# Patient Record
Sex: Male | Born: 1950 | Race: White | Hispanic: No | Marital: Married | Smoking: Never smoker
Health system: Southern US, Community
[De-identification: ages and names within clinical notes are randomized; demographics above are authoritative.]

## PROBLEM LIST (undated history)

## (undated) DIAGNOSIS — I10 Essential (primary) hypertension: Secondary | ICD-10-CM

## (undated) DIAGNOSIS — E785 Hyperlipidemia, unspecified: Secondary | ICD-10-CM

## (undated) DIAGNOSIS — G43909 Migraine, unspecified, not intractable, without status migrainosus: Secondary | ICD-10-CM

## (undated) DIAGNOSIS — T7840XA Allergy, unspecified, initial encounter: Secondary | ICD-10-CM

## (undated) HISTORY — PX: MANDIBLE SURGERY: SHX707

## (undated) HISTORY — PX: LASIK: SHX215

## (undated) HISTORY — DX: Allergy, unspecified, initial encounter: T78.40XA

## (undated) HISTORY — DX: Essential (primary) hypertension: I10

## (undated) HISTORY — DX: Migraine, unspecified, not intractable, without status migrainosus: G43.909

## (undated) HISTORY — DX: Hyperlipidemia, unspecified: E78.5

---

## 2004-01-12 ENCOUNTER — Encounter: Payer: Self-pay | Admitting: Family Medicine

## 2004-01-12 LAB — CONVERTED CEMR LAB: PSA: NORMAL ng/mL

## 2005-10-08 ENCOUNTER — Ambulatory Visit: Payer: Self-pay | Admitting: Family Medicine

## 2005-10-15 ENCOUNTER — Ambulatory Visit: Payer: Self-pay | Admitting: Family Medicine

## 2006-03-25 DIAGNOSIS — J309 Allergic rhinitis, unspecified: Secondary | ICD-10-CM | POA: Insufficient documentation

## 2006-03-25 DIAGNOSIS — E785 Hyperlipidemia, unspecified: Secondary | ICD-10-CM

## 2006-03-25 DIAGNOSIS — I10 Essential (primary) hypertension: Secondary | ICD-10-CM

## 2006-03-25 DIAGNOSIS — K219 Gastro-esophageal reflux disease without esophagitis: Secondary | ICD-10-CM

## 2006-04-18 ENCOUNTER — Encounter: Payer: Self-pay | Admitting: Family Medicine

## 2006-04-21 ENCOUNTER — Encounter: Payer: Self-pay | Admitting: Family Medicine

## 2006-04-21 ENCOUNTER — Ambulatory Visit: Payer: Self-pay | Admitting: Family Medicine

## 2006-04-21 LAB — CONVERTED CEMR LAB
Cholesterol: 158 mg/dL (ref 0–200)
Glucose, Bld: 94 mg/dL (ref 70–99)
Triglycerides: 68 mg/dL (ref <150)
VLDL: 14 mg/dL (ref 0–40)

## 2006-05-06 ENCOUNTER — Telehealth (INDEPENDENT_AMBULATORY_CARE_PROVIDER_SITE_OTHER): Payer: Self-pay | Admitting: *Deleted

## 2006-06-18 ENCOUNTER — Encounter: Payer: Self-pay | Admitting: Family Medicine

## 2007-04-16 ENCOUNTER — Ambulatory Visit: Payer: Self-pay | Admitting: Family Medicine

## 2007-04-16 LAB — CONVERTED CEMR LAB: LDL Goal: 130 mg/dL

## 2007-04-17 LAB — CONVERTED CEMR LAB
ALT: 18 units/L (ref 0–53)
BUN: 18 mg/dL (ref 6–23)
CO2: 28 meq/L (ref 19–32)
Chloride: 103 meq/L (ref 96–112)
Cholesterol: 153 mg/dL (ref 0–200)
Glucose, Bld: 111 mg/dL — ABNORMAL HIGH (ref 70–99)
LDL Cholesterol: 76 mg/dL (ref 0–99)
Potassium: 4.3 meq/L (ref 3.5–5.3)
Triglycerides: 68 mg/dL (ref <150)
VLDL: 14 mg/dL (ref 0–40)

## 2007-10-14 ENCOUNTER — Telehealth: Payer: Self-pay | Admitting: Family Medicine

## 2008-04-21 ENCOUNTER — Ambulatory Visit: Payer: Self-pay | Admitting: Family Medicine

## 2008-04-25 LAB — CONVERTED CEMR LAB
Albumin: 4.7 g/dL (ref 3.5–5.2)
Alkaline Phosphatase: 76 units/L (ref 39–117)
Creatinine, Ser: 1.07 mg/dL (ref 0.40–1.50)
Glucose, Bld: 114 mg/dL — ABNORMAL HIGH (ref 70–99)
HDL: 54 mg/dL (ref >39)
Potassium: 4.3 meq/L (ref 3.5–5.3)
Sodium: 141 meq/L (ref 135–145)
Total Bilirubin: 0.7 mg/dL (ref 0.3–1.2)
Total Protein: 7.4 g/dL (ref 6.0–8.3)
Triglycerides: 85 mg/dL (ref <150)

## 2008-07-05 ENCOUNTER — Ambulatory Visit: Payer: Self-pay | Admitting: Family Medicine

## 2009-04-12 ENCOUNTER — Ambulatory Visit: Payer: Self-pay | Admitting: Family Medicine

## 2009-04-12 ENCOUNTER — Encounter: Payer: Self-pay | Admitting: Family Medicine

## 2009-04-12 DIAGNOSIS — R7309 Other abnormal glucose: Secondary | ICD-10-CM | POA: Insufficient documentation

## 2009-04-13 LAB — CONVERTED CEMR LAB
ALT: 19 units/L (ref 0–53)
AST: 21 units/L (ref 0–37)
CO2: 22 meq/L (ref 19–32)
Cholesterol: 152 mg/dL (ref 0–200)
Glucose, Bld: 114 mg/dL — ABNORMAL HIGH (ref 70–99)
HDL: 60 mg/dL (ref >39)
LDL Cholesterol: 77 mg/dL (ref 0–99)
Sodium: 141 meq/L (ref 135–145)
Triglycerides: 73 mg/dL (ref <150)
VLDL: 15 mg/dL (ref 0–40)

## 2009-07-25 ENCOUNTER — Ambulatory Visit: Payer: Self-pay | Admitting: Family Medicine

## 2009-08-11 ENCOUNTER — Ambulatory Visit: Payer: Self-pay | Admitting: Family Medicine

## 2009-08-11 ENCOUNTER — Encounter: Admission: RE | Admit: 2009-08-11 | Discharge: 2009-08-11 | Payer: Self-pay | Admitting: Family Medicine

## 2009-08-11 DIAGNOSIS — R062 Wheezing: Secondary | ICD-10-CM

## 2009-08-17 ENCOUNTER — Telehealth: Payer: Self-pay | Admitting: Family Medicine

## 2009-08-31 ENCOUNTER — Ambulatory Visit: Payer: Self-pay | Admitting: Family Medicine

## 2009-09-06 ENCOUNTER — Telehealth: Payer: Self-pay | Admitting: Family Medicine

## 2009-10-19 ENCOUNTER — Encounter: Payer: Self-pay | Admitting: Family Medicine

## 2010-04-23 ENCOUNTER — Encounter: Payer: Self-pay | Admitting: Family Medicine

## 2010-07-17 NOTE — Assessment & Plan Note (Signed)
Summary: wheezing   Vital Signs:  Patient profile:   60 year old male Height:      70 inches Weight:      240 pounds BMI:     34.56 O2 Sat:      96 % on Room air Temp:     98.5 degrees F oral Pulse rate:   72 / minute BP sitting:   127 / 81  (left arm) Cuff size:   large  Vitals Entered By: Payton Spark CMA (August 11, 2009 3:26 PM)  O2 Flow:  Room air CC: Chest congestion- worse at night. Finished ABX 2 days ago.    Primary Care Provider:  Nani Gasser MD  CC:  Chest congestion- worse at night. Finished ABX 2 days ago. Marland Kitchen  History of Present Illness: Mr. Zachary Miranda is a 60 year old man presenting with wheezing and chest congestion. He had a sinus infection a few weeks ago and finished a course of Augmentin on Tuesday. Starting on Sunday he noticed he had a mild wheeze at night, and each day it has gotten worse. He does not wheezed during the day. Over the last three days he has had some chest soreness and has been coughing at night along with the wheeze. Has coughed up some phlegm which is metallic-tasting. Last night he had some shortness of breath and had a panic attack because it felt like he couldn't breathe. No history of asthma or wheezing with a cold. He has tried Mucinex-DM which he does think has helped. Mild diarrhea yesterday. No fever, nasal congestion/runny nose since his sinus infection resolved, sore throat, nausea, vomiting, or constipation.   He does have a history of GERD but is no longer taking Prevacid daily.   Current Medications (verified): 1)  Allegra-D 12 Hour 60-120 Mg Tb12 (Fexofenadine-Pseudoephedrine) .... One By Mouth Every Twelve Hours 2)  Aspirin 325 Mg Tabs (Aspirin) .... Take 1 Tablet By Mouth Once A Day 3)  Prilosec 20 Mg Cpdr (Omeprazole) .... Take 1 Tablet By Mouth Once A Day As Needed 4)  Quinapril-Hydrochlorothiazide 20-25 Mg Tabs (Quinapril-Hydrochlorothiazide) .... Take 1 Tablet By Mouth Once A Day 5)  Simvastatin 80 Mg Tabs  (Simvastatin) .... Take 1 Tablet By Mouth Once A Day At Bedtime 6)  Co Q-10 120 Mg  Caps (Coenzyme Q10) .... Take 1 Tablet By Mouth Once A Day 7)  Fish Oil 1000 Mg Caps (Omega-3 Fatty Acids) 8)  Fluticasone Propionate 50 Mcg/act Susp (Fluticasone Propionate) .... 2 Sprays Each Nostril Once Day.  Allergies (verified): 1)  ! Crestor (Rosuvastatin Calcium) 2)  ! Lipitor (Atorvastatin Calcium)  Past History:  Past Medical History: Reviewed history from 04/12/2009 and no changes required. Hx of Bilateral Ulnar n. palsy  Hx of occular migraines  MRI-Cervical spine  stress test, echo, carotid U/S 2003  Upper GI EMG 2006   Social History: Reviewed history from 04/21/2008 and no changes required. 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  Married to [place].  Never smoked, 6 EtOH/wk, no drugs, 2 caffeinated drinks/day, is going to gym somewhat regularly.  Review of Systems      See HPI  Physical Exam  General:  Polite well-appearing male in no acute distress. Head:  Normocephalic and atraumatic. sinuses NTTP Eyes:  conjunctiva clear Ears:  TMs pearly gray with normal light reflex bilaterally. Nose:  No erythema or nasal discharge.  Mouth:  Oropharynx clear with no lesions or exudate.  Neck:  Supple with no lymphadenopathy.  Lungs:  Clear  to auscultation with no wheezes, rales, or rhonchi. Normal work of breathing.  dry cough Heart:  RRR with normal S1 and S2. No murmurs, rubs, or gallops.  Pulses:  2+ radial pulses bilaterally. Extremities:  No cyanosis, clubbing, or edema.  Skin:  color normal.   Cervical Nodes:  No lymphadenopathy noted Psych:  Alert and oriented with normal concentration and attention.    Impression & Recommendations:  Problem # 1:  WHEEZING (ICD-786.07) Wheezing and chest tightness s/p resolution of bacterial sinusitis likely represents bronchospam.   Begin 5-day prednisone burst with rescue albuterol inhaler for acute wheezing or SOB. Continue  Mucinex-DM if chest congestion persists. Follow up in 5 days if symptoms have not improved. Will also obtain CXR to r/o pneumonia and will contact pt to initiate antibiotic treatment if positive.   Orders: T-DG Chest 2 View (71020)  Complete Medication List: 1)  Allegra-d 12 Hour 60-120 Mg Tb12 (Fexofenadine-pseudoephedrine) .... One by mouth every twelve hours 2)  Aspirin 325 Mg Tabs (Aspirin) .... Take 1 tablet by mouth once a day 3)  Prilosec 20 Mg Cpdr (Omeprazole) .... Take 1 tablet by mouth once a day as needed 4)  Quinapril-hydrochlorothiazide 20-25 Mg Tabs (Quinapril-hydrochlorothiazide) .... Take 1 tablet by mouth once a day 5)  Simvastatin 80 Mg Tabs (Simvastatin) .... Take 1 tablet by mouth once a day at bedtime 6)  Co Q-10 120 Mg Caps (Coenzyme q10) .... Take 1 tablet by mouth once a day 7)  Fish Oil 1000 Mg Caps (Omega-3 fatty acids) 8)  Fluticasone Propionate 50 Mcg/act Susp (Fluticasone propionate) .... 2 sprays each nostril once day. 9)  Proair Hfa 108 (90 Base) Mcg/act Aers (Albuterol sulfate) .... 2 puffs 4 x a day for 10 days 10)  Prednisone 20 Mg Tabs (Prednisone) .... 3 tabs by mouth once daily x 5 days  Patient Instructions: 1)  CXR today. 2)  Will call you w/ results over the weekend. 3)  Treat with antibiotics only if + for pneumonia. 4)  Take 5 days of Prenisone to help with chest tightness and wheezing. 5)  Use inhaler 2 puffs 4 x a day for wheezing. 6)  Call if cough/ breathing have not improved after 5 days. Prescriptions: PREDNISONE 20 MG TABS (PREDNISONE) 3 tabs by mouth once daily x 5 days  #15 x 0   Entered and Authorized by:   Seymour Bars DO   Signed by:   Seymour Bars DO on 08/11/2009   Method used:   Electronically to        [facility] [phone]* (retail)       [address] [place], [place]  [address]       Ph: [phone]       Fax: [phone]   RxID:   [id number] PROAIR HFA 108 (90 BASE) MCG/ACT AERS (ALBUTEROL SULFATE) 2 puffs 4 x  a day for 10 days  #1 x 0   Entered and Authorized by:   Seymour Bars DO   Signed by:   Seymour Bars DO on 08/11/2009   Method used:   Electronically to        [facility] [phone]* (retail)       [address] [place], [place]  [address]       Ph: [phone]       Fax: [phone]   RxID:   [id number]

## 2010-07-17 NOTE — Letter (Signed)
Summary: [facility] [place]   Imported By: Lanelle Bal 05/04/2010 09:34:26  _____________________________________________________________________  External Attachment:    Type:   Image     Comment:   External Document

## 2010-07-17 NOTE — Progress Notes (Signed)
Summary: Cough  Phone Note Call from Patient   Caller: Patient Summary of Call: Pt LMOM stating he has completed the prednisone and is still using the inhaler but still feels tight in chest, is hoarse and has dry cough. Pt requested ABX. Please advise.   Follow-up for Phone Call        Will call in zpack to cover the sinuses. If not better into next week needs OV>  Follow-up by: Nani Gasser MD,  August 17, 2009 2:08 PM  Additional Follow-up for Phone Call Additional follow up Details #1::        Pt notified of MD instructions. KJ LPN Additional Follow-up by: Kathlene November,  August 17, 2009 2:24 PM    New/Updated Medications: ZITHROMAX Z-PAK 250 MG TABS (AZITHROMYCIN) Take 1 tablet by mouth once a day Prescriptions: ZITHROMAX Z-PAK 250 MG TABS (AZITHROMYCIN) Take 1 tablet by mouth once a day  #1 pack x 0   Entered and Authorized by:   Nani Gasser MD   Signed by:   Nani Gasser MD on 08/17/2009   Method used:   Electronically to        [facility] [phone]* (retail)       [address] [place], [place]  [address]       Ph: [phone]       Fax: [phone]   RxID:   [id number]

## 2010-07-17 NOTE — Assessment & Plan Note (Signed)
Summary: wheezing, cough, HTN   Vital Signs:  Patient profile:   60 year old male Height:      70 inches Weight:      241 pounds O2 Sat:      95 % on Room air Pulse rate:   81 / minute BP sitting:   142 / 77  (left arm) Cuff size:   large  Vitals Entered By: Kathlene November (August 31, 2009 3:56 PM)  O2 Flow:  Room air  Serial Vital Signs/Assessments:                                PEF    PreRx  PostRx Time      O2 Sat  O2 Type     L/min  L/min  L/min   By 3:58 PM   95  %               500    440    400     Kim Johnson  Comments: 3:58 PM pt in green zone By: Kathlene November   CC: cough and trouble breathing still- having to use inhaler more frequently   Primary Care Provider:  Nani Gasser MD  CC:  cough and trouble breathing still- having to use inhaler more frequently.  History of Present Illness: cough and trouble breathing still- having to use inhaler more frequently. Was seen at the end of an episodes os sinusitis. Note reviewed. Pt reports teh prednisone didn't help.  Then took zpack and it didn't help. Has been using the inhaler adn feels that it does help. Tried allegra and claritin but hasn't really noticed a difference.  Was using the inhaler 4 x a day.  Had a normal CXR. Now feels the inhaler isn't lasting as long so using 4-5 x a day. Says wihting 10 min of taking 2 puffs feels a noticeable difference. . Now only lasting a couple of hours.  Dry cough.  Has coughed up any phlegm for over a month. Feels fatigued. No recent GERD sxs.    Current Medications (verified): 1)  Aspirin 325 Mg Tabs (Aspirin) .... Take 1 Tablet By Mouth Once A Day 2)  Prilosec 20 Mg Cpdr (Omeprazole) .... Take 1 Tablet By Mouth Once A Day As Needed 3)  Quinapril-Hydrochlorothiazide 20-25 Mg Tabs (Quinapril-Hydrochlorothiazide) .... Take 1 Tablet By Mouth Once A Day 4)  Simvastatin 80 Mg Tabs (Simvastatin) .... Take 1 Tablet By Mouth Once A Day At Bedtime 5)  Co Q-10 120 Mg  Caps (Coenzyme  Q10) .... Take 1 Tablet By Mouth Once A Day 6)  Fish Oil 1000 Mg Caps (Omega-3 Fatty Acids) 7)  Fluticasone Propionate 50 Mcg/act Susp (Fluticasone Propionate) .... 2 Sprays Each Nostril Once Day. 8)  Proair Hfa 108 (90 Base) Mcg/act Aers (Albuterol Sulfate) .... 2 Puffs 4 X A Day For 10 Days 9)  Claritin 10 Mg Tabs (Loratadine) .... Take One Tablet By Mouth Once A Day  Allergies (verified): 1)  ! Crestor (Rosuvastatin Calcium) 2)  ! Lipitor (Atorvastatin Calcium)  Comments:  Nurse/Medical Assistant: The patient's medications and allergies were reviewed with the patient and were updated in the Medication and Allergy Lists. Kathlene November (August 31, 2009 3:59 PM)  Social History: Reviewed history from 04/21/2008 and no changes required. 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  Never smoked, 6 EtOH/wk, no drugs, 2 caffeinated drinks/day, is  going to gym somewhat regularly.  Physical Exam  General:  Well-developed,well-nourished,in no acute distress; alert,appropriate and cooperative throughout examination Head:  Normocephalic and atraumatic without obvious abnormalities. No apparent alopecia or balding. Lungs:  Normal respiratory effort, chest expands symmetrically. Lungs are clear to auscultation, no crackles or wheezes. Heart:  Normal rate and regular rhythm. S1 and S2 normal without gallop, murmur, click, rub or other extra sounds. Skin:  no rashes.   Psych:  Cognition and judgment appear intact. Alert and cooperative with normal attention span and concentration. No apparent delusions, illusions, hallucinations   Impression & Recommendations:  Problem # 1:  WHEEZING (ICD-786.07) Assessment Deteriorated  Consider may be from the ACEi thought he feels he does rpspond the inhaler. Since responds to the inhaler will change to symbicort. Samples given.  Continue his claritin for now as part of this could be allergies thought would likel have improved by  now.  I really don't think this is bonchitis as the cough is originated from his throat and not really his chest.  He is not having GERD sxs so less likley . Also gave samples of singulair to try next week if stopping the aACEi doesn't work. If not better in 2 weeks then will schedule for spirometry.   Orders: Peak Flow Rate (94150)  Problem # 2:  HYPERTENSION, BENIGN SYSTEMIC (ICD-401.1) Assessment: Deteriorated Will change to ARB for possible ace cough. Samples given of Benicar  HCT 40/25 to try for 2 weeks. IF works well will give rx or consider changing to losartan HCT.   His updated medication list for this problem includes:    Quinapril-hydrochlorothiazide 20-25 Mg Tabs (Quinapril-hydrochlorothiazide) .Marland Kitchen... Take 1 tablet by mouth once a day  Complete Medication List: 1)  Aspirin 325 Mg Tabs (Aspirin) .... Take 1 tablet by mouth once a day 2)  Prilosec 20 Mg Cpdr (Omeprazole) .... Take 1 tablet by mouth once a day as needed 3)  Quinapril-hydrochlorothiazide 20-25 Mg Tabs (Quinapril-hydrochlorothiazide) .... Take 1 tablet by mouth once a day 4)  Simvastatin 80 Mg Tabs (Simvastatin) .... Take 1 tablet by mouth once a day at bedtime 5)  Co Q-10 120 Mg Caps (Coenzyme q10) .... Take 1 tablet by mouth once a day 6)  Fish Oil 1000 Mg Caps (Omega-3 fatty acids) 7)  Fluticasone Propionate 50 Mcg/act Susp (Fluticasone propionate) .... 2 sprays each nostril once day. 8)  Proair Hfa 108 (90 Base) Mcg/act Aers (Albuterol sulfate) .... 2 puffs 4 x a day for 10 days 9)  Claritin 10 Mg Tabs (Loratadine) .... Take one tablet by mouth once a day  Patient Instructions: 1)  Stop the quinapril-hctz and take the benicar hctz samples instead. One a day.   2)  Take the singulair  once a day for 5 days.  3)  Can start the symbicort 1 puffs inhaled two times a day

## 2010-07-17 NOTE — Letter (Signed)
Summary: [facility] [place]   Imported By: Lanelle Bal 11/03/2009 11:30:13  _____________________________________________________________________  External Attachment:    Type:   Image     Comment:   External Document

## 2010-07-17 NOTE — Progress Notes (Signed)
Summary: BP med  Phone Note Call from Patient Call back at Home Phone [phone]   Caller: Patient Call For: Nani Gasser MD Summary of Call: Benicar working well would like is only taking 1/2 tablet though. Send [facility]. Initial call taken by: Kathlene November,  September 06, 2009 3:01 PM  Follow-up for Phone Call        Needs to take whole pill.   The meds are different and 20mg  of one isn't the same as 20mg  of the other. What I changed him too is the most equivalent but is not exact. Only cut in half if BP is low.  Follow-up by: Nani Gasser MD,  September 06, 2009 3:06 PM    New/Updated Medications: BENICAR HCT 40-25 MG TABS (OLMESARTAN MEDOXOMIL-HCTZ) Take 1 tablet by mouth once a day Prescriptions: BENICAR HCT 40-25 MG TABS (OLMESARTAN MEDOXOMIL-HCTZ) Take 1 tablet by mouth once a day  #90 x 0   Entered and Authorized by:   Nani Gasser MD   Signed by:   Nani Gasser MD on 09/06/2009   Method used:   Printed then faxed to ...       [facility] (mail-order)       [address] [place], [place]  [address]       Ph: [phone]       Fax: [phone]   RxID:   [id number]

## 2010-07-17 NOTE — Assessment & Plan Note (Signed)
Summary: Acute sinusitis   Vital Signs:  Patient profile:   60 year old male Height:      70 inches Weight:      241.50 pounds Temp:     98.2 degrees F oral Pulse rate:   90 / minute BP sitting:   122 / 86  Vitals Entered By: Kandice Hams (July 25, 2009 3:46 PM) CC: c/o sinus inf bloody mucous at night, runny nose during day   Primary Care Provider:  Linford Arnold, C  CC:  c/o sinus inf bloody mucous at night and runny nose during day.  History of Present Illness: c/o sinus inf bloody mucous at night, runny nose during day.  Sxs started about 7 weeks. No fever.  Trying to take Allegra-D, Advil cold and sinus - helps some  + sneezing.  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 Better over the weekend.  No ST or ear pain.   Allergies: 1)  ! Crestor (Rosuvastatin Calcium) 2)  ! Lipitor (Atorvastatin Calcium)  Physical Exam  General:  Well-developed,well-nourished,in no acute distress; alert,appropriate and cooperative throughout examination Head:  Normocephalic and atraumatic without obvious abnormalities. No apparent alopecia or balding. Eyes:  No corneal or conjunctival inflammation noted. EOMI. Perrla.  Ears:  External ear exam shows no significant lesions or deformities.  Otoscopic examination reveals clear canals, tympanic membranes are intact bilaterally without bulging, retraction, inflammation or discharge. Hearing is grossly normal bilaterally. Nose:  External nasal examination shows no deformity or inflammation. Nasal mucosa are pink and moist without lesions or exudates. Mouth:  Oral mucosa and oropharynx without lesions or exudates.  Teeth in good repair. Neck:  No deformities, masses, or tenderness noted. Lungs:  Normal respiratory effort, chest expands symmetrically. Lungs are clear to auscultation, no crackles or wheezes. Heart:  Normal rate and regular rhythm. S1 and S2 normal without gallop, murmur, click, rub or other extra sounds. Skin:  no  rashes.   Cervical Nodes:  No lymphadenopathy noted Psych:  Cognition and judgment appear intact. Alert and cooperative with normal attention span and concentration. No apparent delusions, illusions, hallucinations   Impression & Recommendations:  Problem # 1:  SINUSITIS - ACUTE-NOS (ICD-461.9)  His updated medication list for this problem includes:    Allegra-d 12 Hour 60-120 Mg Tb12 (Fexofenadine-pseudoephedrine) ..... One by mouth every twelve hours    Augmentin 875-125 Mg Tabs (Amoxicillin-pot clavulanate) .Marland Kitchen... Take 1 tablet by mouth two times a day for 14 days    Fluticasone Propionate 50 Mcg/act Susp (Fluticasone propionate) .Marland Kitchen... 2 sprays each nostril once day.  Instructed on treatment. Call if symptoms persist or worsen. If not better in one week then please call. Will aslo add a nasal steroid to the regiment to see if better, as some of this may be allergic rhinitis.   Complete Medication List: 1)  Allegra-d 12 Hour 60-120 Mg Tb12 (Fexofenadine-pseudoephedrine) .... One by mouth every twelve hours 2)  Aspirin 325 Mg Tabs (Aspirin) .... Take 1 tablet by mouth once a day 3)  Prilosec 20 Mg Cpdr (Omeprazole) .... Take 1 tablet by mouth once a day as needed 4)  Quinapril-hydrochlorothiazide 20-25 Mg Tabs (Quinapril-hydrochlorothiazide) .... Take 1 tablet by mouth once a day 5)  Simvastatin 80 Mg Tabs (Simvastatin) .... Take 1 tablet by mouth once a day at bedtime 6)  Co Q-10 120 Mg Caps (Coenzyme q10) .... Take 1 tablet by mouth once a day 7)  Fish Oil 1000 Mg Caps (Omega-3 fatty acids) 8)  Augmentin 875-125 Mg Tabs (Amoxicillin-pot clavulanate) .... Take 1 tablet by mouth two times a day for 14 days 9)  Fluticasone Propionate 50 Mcg/act Susp (Fluticasone propionate) .... 2 sprays each nostril once day. Prescriptions: FLUTICASONE PROPIONATE 50 MCG/ACT SUSP (FLUTICASONE PROPIONATE) 2 sprays each nostril once day.  #1 bottle x 1   Entered and Authorized by:   Nani Gasser MD    Signed by:   Nani Gasser MD on 07/25/2009   Method used:   Electronically to        [facility] [phone]* (retail)       [address] [place], [place]  [address]       Ph: [phone]       Fax: [phone]   RxID:   [id number] AUGMENTIN 875-125 MG TABS (AMOXICILLIN-POT CLAVULANATE) Take 1 tablet by mouth two times a day for 14 days  #28 x 0   Entered and Authorized by:   Nani Gasser MD   Signed by:   Nani Gasser MD on 07/25/2009   Method used:   Electronically to        [facility] [phone]* (retail)       [address] [place], [place]  [address]       Ph: [phone]       Fax: [phone]   RxID:   [id number]

## 2010-12-05 ENCOUNTER — Other Ambulatory Visit: Payer: Self-pay | Admitting: Family Medicine

## 2011-02-06 ENCOUNTER — Encounter: Payer: Self-pay | Admitting: Family Medicine

## 2011-02-15 ENCOUNTER — Encounter: Payer: Self-pay | Admitting: Family Medicine

## 2011-02-21 ENCOUNTER — Encounter: Payer: Self-pay | Admitting: Family Medicine

## 2011-02-21 ENCOUNTER — Ambulatory Visit (INDEPENDENT_AMBULATORY_CARE_PROVIDER_SITE_OTHER): Payer: Managed Care, Other (non HMO) | Admitting: Family Medicine

## 2011-02-21 VITALS — BP 117/75 | HR 66 | Ht 70.0 in | Wt 241.0 lb

## 2011-02-21 DIAGNOSIS — Z0000 Encounter for general adult medical examination without abnormal findings: Secondary | ICD-10-CM

## 2011-02-21 DIAGNOSIS — Z8249 Family history of ischemic heart disease and other diseases of the circulatory system: Secondary | ICD-10-CM

## 2011-02-21 DIAGNOSIS — I10 Essential (primary) hypertension: Secondary | ICD-10-CM

## 2011-02-21 DIAGNOSIS — Z23 Encounter for immunization: Secondary | ICD-10-CM

## 2011-02-21 LAB — LIPID PANEL: LDL Cholesterol: 96 mg/dL (ref 0–99)

## 2011-02-21 LAB — TSH: TSH: 1.652 u[IU]/mL (ref 0.350–4.500)

## 2011-02-21 MED ORDER — OLMESARTAN MEDOXOMIL-HCTZ 40-25 MG PO TABS: 1.0000 | ORAL_TABLET | Freq: Every day | ORAL | Status: DC

## 2011-02-21 MED ORDER — SIMVASTATIN 40 MG PO TABS: 40.0000 mg | ORAL_TABLET | Freq: Every day | ORAL | Status: DC

## 2011-02-21 NOTE — Progress Notes (Signed)
Subjective:    Patient ID: Zachary Miranda, male    DOB: 1950-09-19, 60 y.o.   MRN: [id number]  HPI Brother with abdominal aortic aneurysm and would like to be tested. His brother was a smoker. He did have to have a repair. He does have 2 brothers and a father with early heart disease. His last stress test was approximately 10 years ago.  Bought home BP cuff and seems to be running well controlled.  Still working out regularly.     Review of Systems Neg comprehensive ROS.    BP 117/75  Pulse 66  Wt 241 lb (109.317 kg)    Allergies  Allergen Reactions  . Atorvastatin     REACTION: myalgias  . Rosuvastatin     REACTION: cause myalgias    Past Medical History  Diagnosis Date  . Migraines     occular  . Hyperlipidemia   . Hypertension   . Allergy     Past Surgical History  Procedure Date  . Mandible surgery   . Lasik     History   Social History  . Marital Status: Married    Spouse Name: N/A    Number of Children: N/A  . Years of Education: N/A   Occupational History  . Not on file.   Social History Main Topics  . Smoking status: Never Smoker   . Smokeless tobacco: Not on file  . Alcohol Use: 3.0 oz/week    6 drink(s) per week  . Drug Use: No  . Sexually Active: Not on file   Other Topics Concern  . Not on file   Social History Narrative  . No narrative on file    Family History  Problem Relation Age of Onset  . Cancer Father     melanoma, died age 64  . Heart disease Father 87    MI  . Hyperlipidemia Sister   . Hyperlipidemia Brother   . Hypertension Brother   . Breast cancer Sister   . Heart disease Brother   . Heart disease Brother 52  . Aortic aneurysm Brother 59    correction     Mr. Disney does not currently have medications on file.  Objective:   Physical Exam  Constitutional: He is oriented to person, place, and time. He appears well-developed and well-nourished.  HENT:  Head: Normocephalic and atraumatic.  Right  Ear: External ear normal.  Left Ear: External ear normal.  Nose: Nose normal.  Mouth/Throat: Oropharynx is clear and moist.  Eyes: Conjunctivae and EOM are normal. Pupils are equal, round, and reactive to light.  Neck: Normal range of motion. Neck supple. No thyromegaly present.  Cardiovascular: Normal rate, regular rhythm, normal heart sounds and intact distal pulses.   Pulmonary/Chest: Effort normal and breath sounds normal.  Abdominal: Soft. Bowel sounds are normal. He exhibits no distension and no mass. There is no tenderness. There is no rebound and no guarding.  Genitourinary: Rectum normal.       Prostate in mildly enlarged and flat. No lesions or bogginess.   Musculoskeletal: Normal range of motion.  Lymphadenopathy:    He has no cervical adenopathy.  Neurological: He is alert and oriented to person, place, and time. He has normal reflexes.  Skin: Skin is warm and dry.  Psychiatric: He has a normal mood and affect. His behavior is normal. Judgment and thought content normal.          Assessment & Plan:  CPE-  Start a regular exercise program  and make sure you are eating a healthy diet Try to eat 4 servings of dairy a day or take a calcium supplement (500mg  twice a day). Your vaccines are up to date.  Lab slip was given for screening labs. We will get him scheduled for an abdominal aortic aneurysm scan because of his brothers family history. His brothers vascular surgeon had recommended that his siblings be tested. He has not been a smoker himself. Though, his brother was.  I did send  refills on his medications. EKG shows normal sinus rhythm with a rate of 60 beats per minute. He does have poor R-wave progression. He also has inverted T waves in lead 3. This is consistent with his old EKG.

## 2011-02-22 LAB — COMPLETE METABOLIC PANEL WITH GFR
ALT: 18 U/L (ref 0–53)
AST: 22 U/L (ref 0–37)
Alkaline Phosphatase: 63 U/L (ref 39–117)
BUN: 17 mg/dL (ref 6–23)
Calcium: 9.4 mg/dL (ref 8.4–10.5)
GFR, Est Non African American: >60 mL/min (ref >60)
Potassium: 4.4 mEq/L (ref 3.5–5.3)
Total Bilirubin: 0.8 mg/dL (ref 0.3–1.2)
Total Protein: 6.9 g/dL (ref 6.0–8.3)

## 2011-02-25 ENCOUNTER — Telehealth: Payer: Self-pay | Admitting: Family Medicine

## 2011-02-25 NOTE — Telephone Encounter (Signed)
Call pt; and blood sugar is stable. Still in the prediabetic range. Cholesterol looked great. Though, he will stay on cholesterol pill. Thyroid and PSA are normal.

## 2011-02-25 NOTE — Telephone Encounter (Signed)
Pt.notified

## 2011-03-08 ENCOUNTER — Ambulatory Visit
Admission: RE | Admit: 2011-03-08 | Discharge: 2011-03-08 | Disposition: A | Discharge status: Home / Self Care | Payer: Managed Care, Other (non HMO) | Source: Ambulatory Visit | Attending: Family Medicine | Admitting: Family Medicine

## 2011-03-08 ENCOUNTER — Telehealth: Payer: Self-pay | Admitting: *Deleted

## 2011-03-08 DIAGNOSIS — Z8249 Family history of ischemic heart disease and other diseases of the circulatory system: Secondary | ICD-10-CM

## 2011-03-08 NOTE — Telephone Encounter (Signed)
Message copied by Wyline Beady on Fri Mar 08, 2011  3:00 PM ------      Message from: Nani Gasser D      Created: Fri Mar 08, 2011 12:38 PM       No abdominal aortic aneurysm. 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 No further workup is needed. Cysts are very common.

## 2011-03-08 NOTE — Telephone Encounter (Signed)
Left message on vm with results  

## 2011-03-11 ENCOUNTER — Encounter: Payer: Self-pay | Admitting: Family Medicine

## 2011-04-22 ENCOUNTER — Other Ambulatory Visit: Payer: Self-pay | Admitting: Family Medicine

## 2011-12-02 ENCOUNTER — Other Ambulatory Visit: Payer: Self-pay | Admitting: Family Medicine

## 2011-12-03 NOTE — Telephone Encounter (Signed)
Needs appointment

## 2012-01-02 ENCOUNTER — Encounter: Payer: Self-pay | Admitting: Family Medicine

## 2012-01-02 ENCOUNTER — Ambulatory Visit (INDEPENDENT_AMBULATORY_CARE_PROVIDER_SITE_OTHER): Payer: Managed Care, Other (non HMO) | Admitting: Family Medicine

## 2012-01-02 VITALS — BP 103/67 | HR 64 | Ht 70.0 in | Wt 239.0 lb

## 2012-01-02 DIAGNOSIS — G5620 Lesion of ulnar nerve, unspecified upper limb: Secondary | ICD-10-CM

## 2012-01-02 DIAGNOSIS — J309 Allergic rhinitis, unspecified: Secondary | ICD-10-CM

## 2012-01-02 DIAGNOSIS — E785 Hyperlipidemia, unspecified: Secondary | ICD-10-CM

## 2012-01-02 DIAGNOSIS — I10 Essential (primary) hypertension: Secondary | ICD-10-CM

## 2012-01-02 LAB — LIPID PANEL
Cholesterol: 150 mg/dL (ref 0–200)
Total CHOL/HDL Ratio: 2.9 Ratio
Triglycerides: 92 mg/dL (ref <150)
VLDL: 18 mg/dL (ref 0–40)

## 2012-01-02 LAB — COMPLETE METABOLIC PANEL WITH GFR
Alkaline Phosphatase: 57 U/L (ref 39–117)
Chloride: 103 mEq/L (ref 96–112)
GFR, Est Non African American: 68 mL/min
Potassium: 4.2 mEq/L (ref 3.5–5.3)
Sodium: 138 mEq/L (ref 135–145)

## 2012-01-02 MED ORDER — OLMESARTAN MEDOXOMIL-HCTZ 40-12.5 MG PO TABS: 1.0000 | ORAL_TABLET | Freq: Every day | ORAL | Status: DC

## 2012-01-02 MED ORDER — SIMVASTATIN 40 MG PO TABS: 40.0000 mg | ORAL_TABLET | Freq: Every day | ORAL | Status: DC

## 2012-01-02 NOTE — Progress Notes (Signed)
  Subjective:    Patient ID: Zachary Miranda, male    DOB: 09/22/1950, 61 y.o.   MRN: [id number]  HPI HTN- No CP or SOB.  Taking meds regularly.  No decongestants or NSAIDs. He does report a bends over and stands up too quickly he does still a little lightheaded for just a couple of seconds. No side effects from the medication.  AR - Says allergies really flaried a couple of weeks ago.  Called in for his omnaris.  Says he is much better.    Hyperlipidemia-tolerating simvastatin well without any myalgias or side effects. He is due for refill.  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 Gets numbness int eh middle and ring finger in the right hand at night. It is positional so it better with sitting up and walking around.  Has some sxs in his left hand too. Had EMG studies and MRI a couple of years right before he moved here. He said eventually he was told he may need surgery for correction. He says he is not ready for surgery yet but just wanted to let me know that it has been a little bit more bothersome than usual.  Review of Systems     Objective:   Physical Exam  Constitutional: He is oriented to person, place, and time. He appears well-developed and well-nourished.  HENT:  Head: Normocephalic and atraumatic.  Neck: Neck supple. No thyromegaly present.  Cardiovascular: Normal rate, regular rhythm and normal heart sounds.        No carotid bruits.  Pulmonary/Chest: Effort normal and breath sounds normal.  Musculoskeletal: He exhibits no edema.  Lymphadenopathy:    He has no cervical adenopathy.  Neurological: He is alert and oriented to person, place, and time.  Skin: Skin is warm and dry.  Psychiatric: He has a normal mood and affect. His behavior is normal.          Assessment & Plan:  HTN- Well controlled Will dec his hctz component of his Benicar HCT. I think he is doing fantastic. He has lost about 3 more pounds. Encouraged him to continue with diet and  exercise. Followup in 6 months. He is due for blood work.  Hyperlipidemia-due to recheck his cholesterol. It looked great in September of last year. Continue simvastatin as he is tolerating it well. New prescriptions sent to pharmacy.  AR - doing much better. Continue nasal spray as needed.  History of ulnar nerve impingement-I explained to him that surgery is warranted if he starts to notice any weakness in either hand or if he just gets the point where the discomfort or pain is intolerable for him. He says he will continue to monitor the situation let me know what he thinks he may be ready for fall for possible surgical intervention. It definitely seems to be aggravated by position and is relieved when he is sitting upright.

## 2012-01-03 NOTE — Progress Notes (Signed)
Quick Note:  All labs are normal. ______ 

## 2012-06-30 ENCOUNTER — Other Ambulatory Visit: Payer: Self-pay | Admitting: Family Medicine

## 2012-07-06 ENCOUNTER — Encounter: Payer: Self-pay | Admitting: Family Medicine

## 2012-07-06 ENCOUNTER — Ambulatory Visit (INDEPENDENT_AMBULATORY_CARE_PROVIDER_SITE_OTHER): Payer: Managed Care, Other (non HMO) | Admitting: Family Medicine

## 2012-07-06 VITALS — BP 118/65 | HR 75 | Resp 18 | Wt 242.0 lb

## 2012-07-06 DIAGNOSIS — R7309 Other abnormal glucose: Secondary | ICD-10-CM

## 2012-07-06 DIAGNOSIS — G5620 Lesion of ulnar nerve, unspecified upper limb: Secondary | ICD-10-CM

## 2012-07-06 DIAGNOSIS — I10 Essential (primary) hypertension: Secondary | ICD-10-CM

## 2012-07-06 DIAGNOSIS — R0602 Shortness of breath: Secondary | ICD-10-CM

## 2012-07-06 DIAGNOSIS — E785 Hyperlipidemia, unspecified: Secondary | ICD-10-CM

## 2012-07-06 LAB — COMPLETE METABOLIC PANEL WITH GFR
ALT: 21 U/L (ref 0–53)
Albumin: 4.8 g/dL (ref 3.5–5.2)
BUN: 22 mg/dL (ref 6–23)
Calcium: 9.8 mg/dL (ref 8.4–10.5)
Creat: 1.16 mg/dL (ref 0.50–1.35)
GFR, Est African American: 78 mL/min
Glucose, Bld: 107 mg/dL — ABNORMAL HIGH (ref 70–99)
Sodium: 144 mEq/L (ref 135–145)

## 2012-07-06 LAB — LIPID PANEL
Cholesterol: 174 mg/dL (ref 0–200)
LDL Cholesterol: 105 mg/dL — ABNORMAL HIGH (ref 0–99)
Triglycerides: 95 mg/dL (ref <150)

## 2012-07-06 LAB — HEMOGLOBIN A1C: Hgb A1c MFr Bld: 6.1 % — ABNORMAL HIGH (ref <5.7)

## 2012-07-06 MED ORDER — SIMVASTATIN 40 MG PO TABS: 40.0000 mg | ORAL_TABLET | Freq: Every day | ORAL | Status: DC

## 2012-07-06 NOTE — Progress Notes (Signed)
Subjective:    Patient ID: Zachary Miranda, male    DOB: September 17, 1950, 62 y.o.   MRN: [id number]  HPI HTN -  Pt denies chest pain, SOB, dizziness, or heart palpitations.  Taking meds as directed w/o problems.  Denies medication side effects.  5 min spent with pt.  About a month ago noticed SOB and wheezing and started using his Proair for about 10 days and seemed to help.  Says hasn't been the the gym as much.  No Cp or radiation into the arms. No diaphoresis with the episodes. Doing well since them.  No prior hx of heart disease. No prior history of coronary artery disease. He does have hypertension and hyperlipidemia which are certainly risk factors.   hyperlipidemia-doing well on a statin. No myalgias or side effects.    Review of Systems BP 118/65  Pulse 75  Resp 18  Wt 242 lb (109.77 kg)  SpO2 98%    Allergies  Allergen Reactions  . Atorvastatin     REACTION: myalgias  . Rosuvastatin     REACTION: cause myalgias    Past Medical History  Diagnosis Date  . Migraines     occular  . Hyperlipidemia   . Hypertension   . Allergy     Past Surgical History  Procedure Date  . Mandible surgery   . Lasik     History   Social History  . Marital Status: Married    Spouse Name: N/A    Number of Children: N/A  . Years of Education: N/A   Occupational History  . Not on file.   Social History Main Topics  . Smoking status: Never Smoker   . Smokeless tobacco: Never Used  . Alcohol Use: 3.0 oz/week    6 drink(s) per week  . Drug Use: No  . Sexually Active: Not on file   Other Topics Concern  . Not on file   Social History Narrative  . No narrative on file    Family History  Problem Relation Age of Onset  . Cancer Father     melanoma, died age 3  . Heart disease Father 40    MI  . Hyperlipidemia Sister   . Hyperlipidemia Brother   . Hypertension Brother   . Breast cancer Sister   . Heart disease Brother   . Heart disease Brother 77  . Aortic  aneurysm Brother 66    correction     Outpatient Encounter Prescriptions as of 07/06/2012  Medication Sig Dispense Refill  . albuterol (PROAIR HFA) 108 (90 BASE) MCG/ACT inhaler Inhale 2 puffs into the lungs every 6 (six) hours as needed.      Marland Kitchen aspirin 325 MG tablet Take 325 mg by mouth daily.      Marland Kitchen BENICAR HCT 40-12.5 MG per tablet TAKE 1 TABLET BY MOUTH DAILY.  90 tablet  0  . cholecalciferol (VITAMIN D) 1000 UNITS tablet Take 1,000 Units by mouth daily.      Marland Kitchen co-enzyme Q-10 30 MG capsule Take 30 mg by mouth 3 (three) times daily.      . Multiple Vitamins-Minerals (MULTIVITAMIN WITH MINERALS) tablet Take 1 tablet by mouth daily.      . Omega-3 Fatty Acids (FISH OIL) 1000 MG CAPS Take by mouth.      . simvastatin (ZOCOR) 40 MG tablet Take 1 tablet (40 mg total) by mouth at bedtime.  90 tablet  1  . [DISCONTINUED] simvastatin (ZOCOR) 40 MG tablet Take 1 tablet (40  mg total) by mouth at bedtime.  90 tablet  1  . [DISCONTINUED] aspirin 81 MG tablet Take 81 mg by mouth daily.                Objective:   Physical Exam  Constitutional: He is oriented to person, place, and time. He appears well-developed and well-nourished.  HENT:  Head: Normocephalic and atraumatic.  Right Ear: External ear normal.  Left Ear: External ear normal.  Nose: Nose normal.  Mouth/Throat: Oropharynx is clear and moist.       TMs and canals are clear.   Eyes: Conjunctivae normal and EOM are normal. Pupils are equal, round, and reactive to light.  Neck: Neck supple. No thyromegaly present.  Cardiovascular: Normal rate and normal heart sounds.   Pulmonary/Chest: Effort normal and breath sounds normal.  Lymphadenopathy:    He has no cervical adenopathy.  Neurological: He is alert and oriented to person, place, and time.  Skin: Skin is warm and dry.  Psychiatric: He has a normal mood and affect.          Assessment & Plan:  HTN- Well controlled. F/U in 6 months.  Due for labs.   SOB - sound like  episode is consistant with his Asthma.  Did do EKG since HR for heart disease.  Poor r wave progression and flipped T wave in Lead III. No change since 2012. Gave reassurance.  Lungs are clear today on exam. He also uses a second inhaler but he can remove the name of it. He says that it is a powder.  Hyperlpidemia - Will recheck levels today.  RF sent ot pharm.   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 He feels it's getting to the point he may need surgery. He would like referral to an orthopedist here in town. He specifically requests a physician at [facility]. Will put in the referral.

## 2012-07-08 ENCOUNTER — Encounter: Payer: Self-pay | Admitting: *Deleted

## 2012-07-14 ENCOUNTER — Other Ambulatory Visit: Payer: Self-pay | Admitting: Family Medicine

## 2012-09-29 ENCOUNTER — Other Ambulatory Visit: Payer: Self-pay | Admitting: Family Medicine

## 2012-10-28 IMAGING — US US AORTA
1 series · 14 of 25 positions shown · non-contrast
Comparison: None.

CLINICAL DATA: Evaluate for abdominal aortic aneurysm

ULTRASOUND OF ABDOMINAL AORTA
TECHNIQUE: Ultrasound examination of the abdominal aorta was
performed to evaluate for abdominal aortic aneurysm.

[Series 1: us aorta · 0.32mm/px · 14 of 64 slices shown]
[im 1/64]
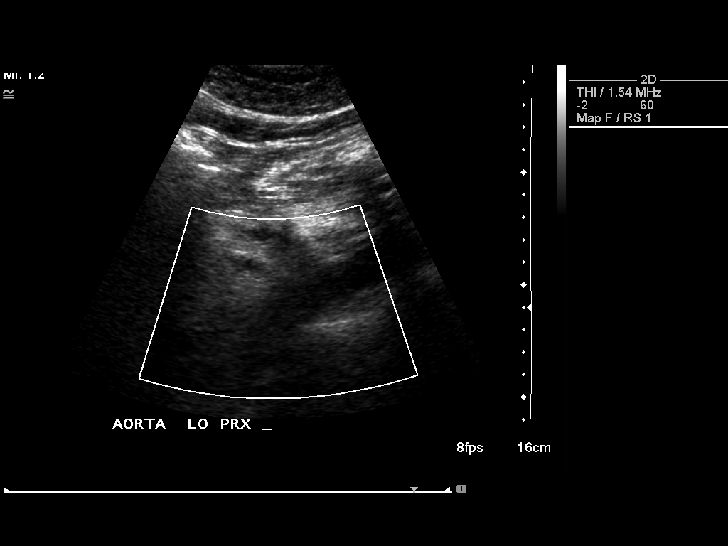
[im 6/64]
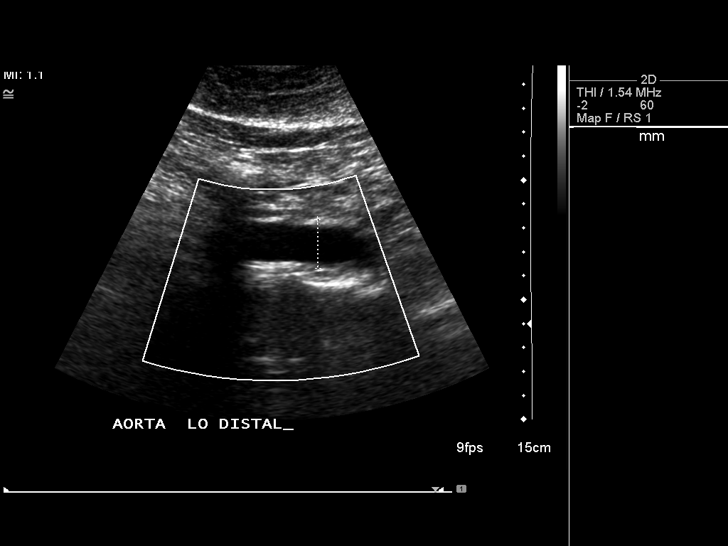
[im 11/64]
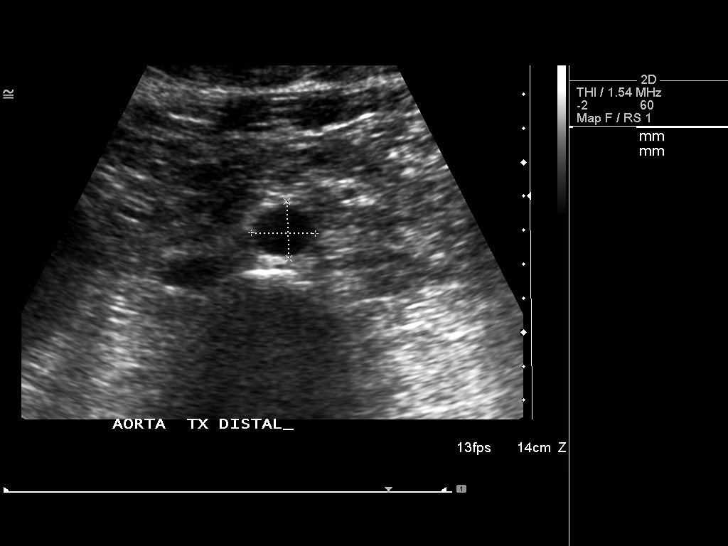
[im 16/64]
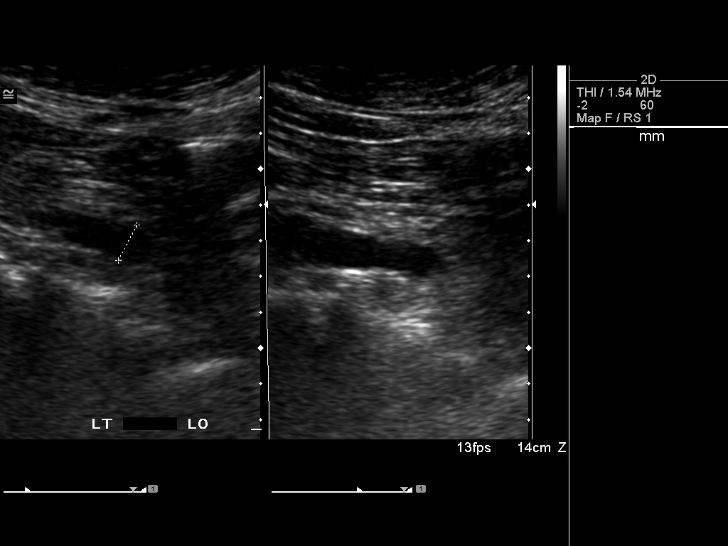
[im 22/64]
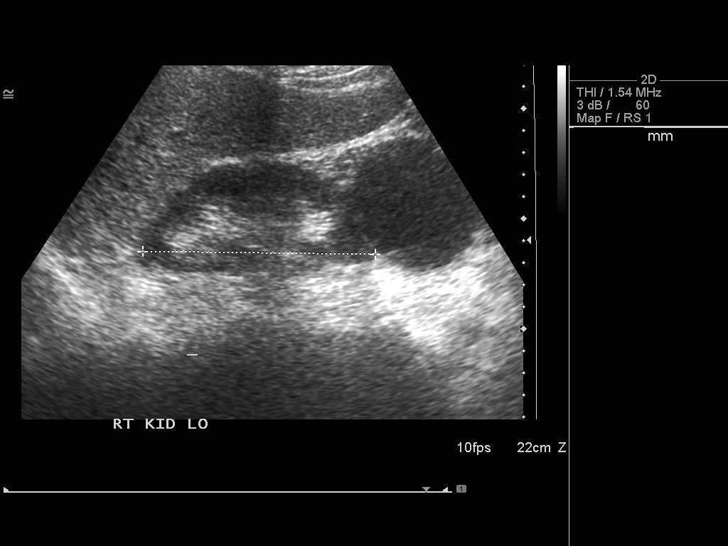
[im 24/64]
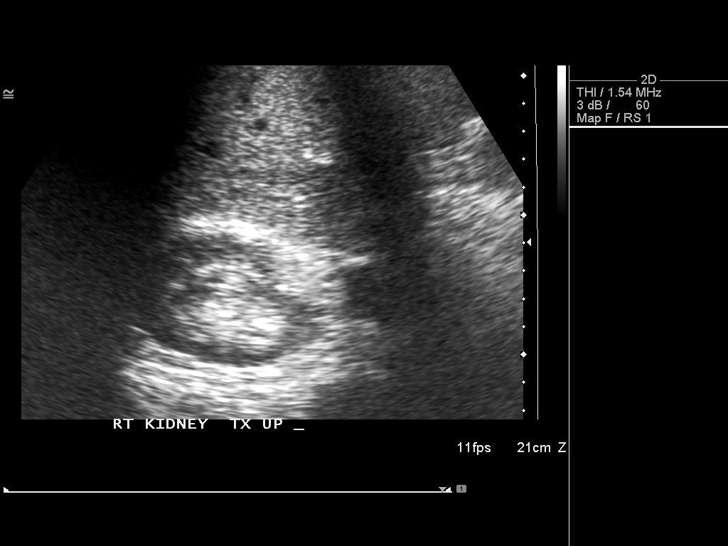
[im 29/64]
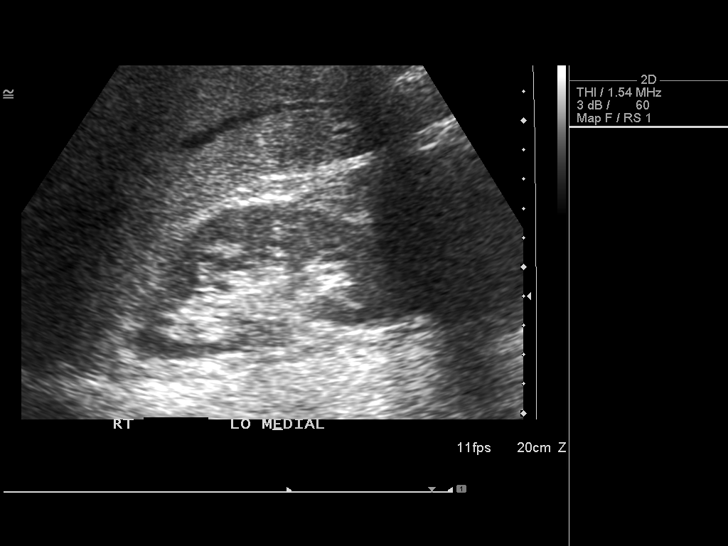
[im 35/64]
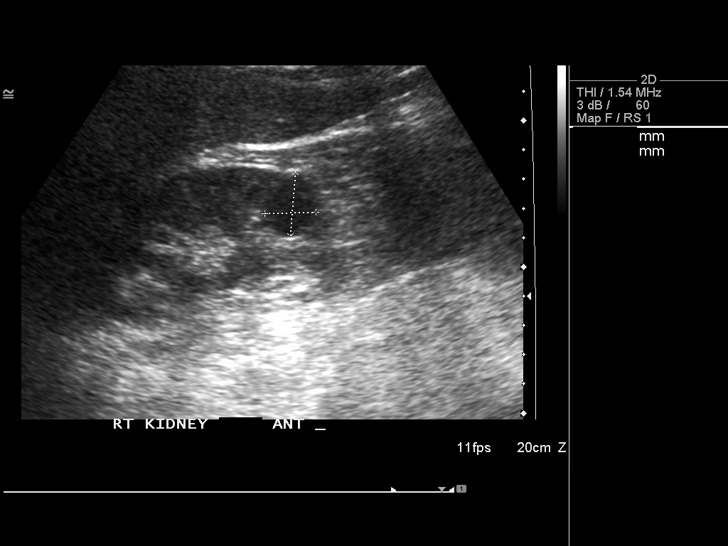
[im 40/64]
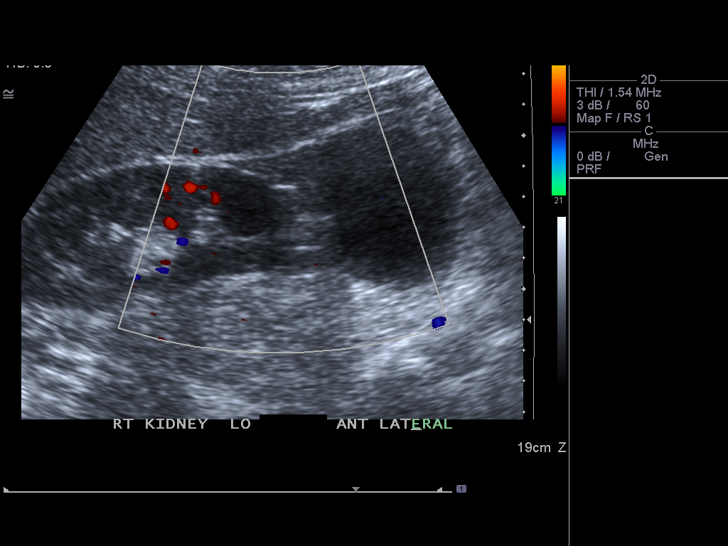
[im 43/64]
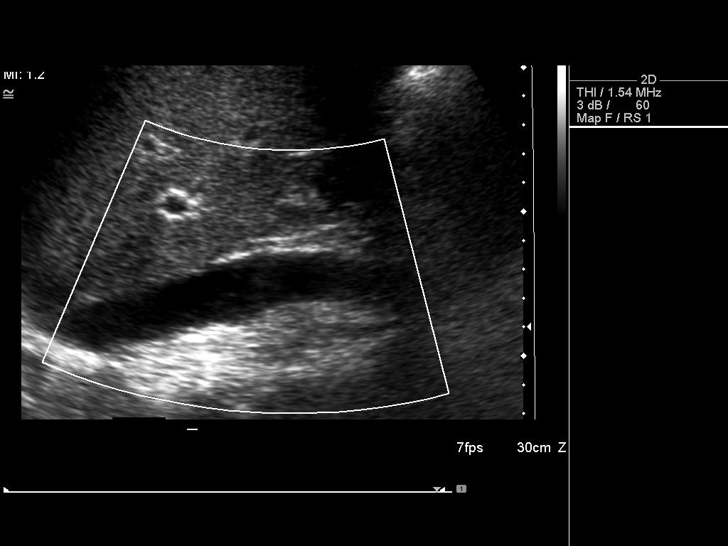
[im 48/64]
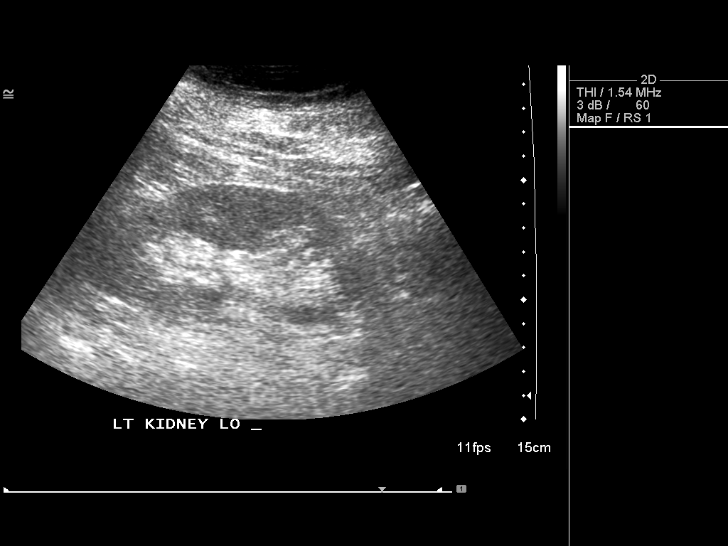
[im 53/64]
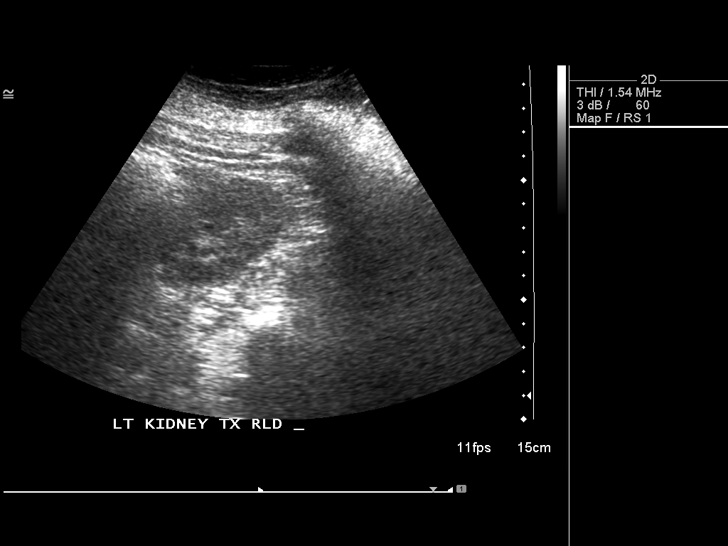
[im 58/64]
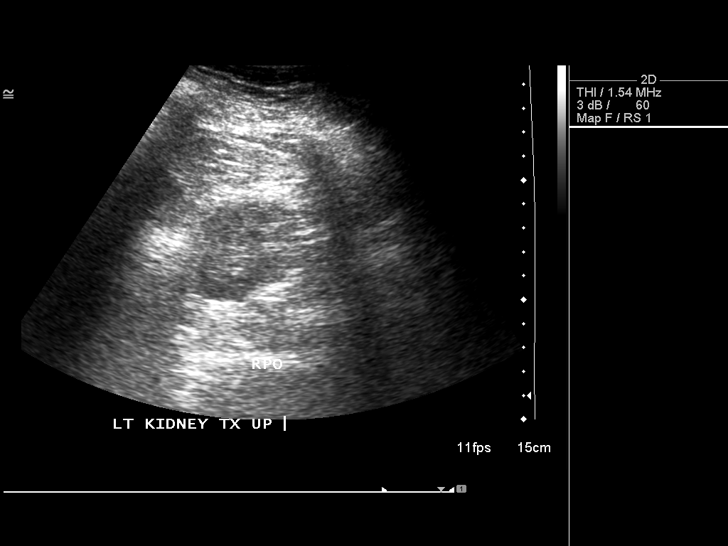
[im 64/64]
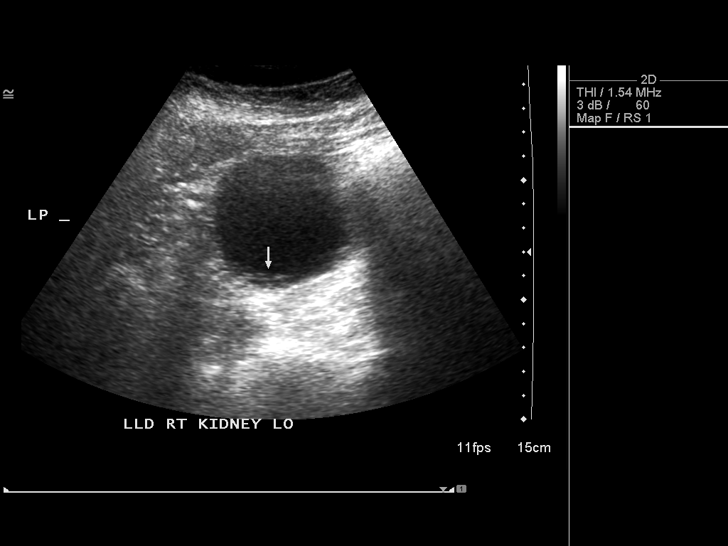

[14 of 25 positions shown; findings below may reference images not displayed]

FINDINGS: Abdominal Aorta: No aneurysm identified. The mid aspect of the
abdominal aorta measures 2.1 x 2.4 cm in greatest AP and transverse
dimensions respectively.

The right kidney is normal in size measuring 10.6 cm in length.
There is an approximately 5.5 x 6.7 cm anechoic lesion arising from
the inferior pole of the right kidney which demonstrates increased
through transmission and no definite interval blood flow, findings
compatible with a simple cyst.  There is an additional 2.3 cm
anechoic lesion within the mid aspect of the right kidney
compatible with renal cyst.  No evidence of right-sided urinary
obstruction.

The left kidney is normal in size measuring 10.8 cm in length.  No
left-sided urinary obstruction.
IMPRESSION: 1.  No abdominal aortic aneurysm identified.

2.  Right sided renal cysts.  No evidence of urinary obstruction.

## 2012-12-16 ENCOUNTER — Encounter: Payer: Self-pay | Admitting: Family Medicine

## 2012-12-16 ENCOUNTER — Ambulatory Visit (INDEPENDENT_AMBULATORY_CARE_PROVIDER_SITE_OTHER): Payer: Managed Care, Other (non HMO) | Admitting: Family Medicine

## 2012-12-16 VITALS — BP 122/73 | HR 66 | Ht 70.0 in | Wt 243.0 lb

## 2012-12-16 DIAGNOSIS — I10 Essential (primary) hypertension: Secondary | ICD-10-CM

## 2012-12-16 DIAGNOSIS — E785 Hyperlipidemia, unspecified: Secondary | ICD-10-CM

## 2012-12-16 DIAGNOSIS — R7301 Impaired fasting glucose: Secondary | ICD-10-CM

## 2012-12-16 DIAGNOSIS — Z125 Encounter for screening for malignant neoplasm of prostate: Secondary | ICD-10-CM

## 2012-12-16 LAB — HEMOGLOBIN A1C: Mean Plasma Glucose: 128 mg/dL — ABNORMAL HIGH (ref <117)

## 2012-12-16 LAB — BASIC METABOLIC PANEL WITH GFR
BUN: 17 mg/dL (ref 6–23)
CO2: 27 mEq/L (ref 19–32)
Chloride: 104 mEq/L (ref 96–112)
GFR, Est Non African American: 74 mL/min
Glucose, Bld: 113 mg/dL — ABNORMAL HIGH (ref 70–99)
Potassium: 4.8 mEq/L (ref 3.5–5.3)

## 2012-12-16 MED ORDER — SIMVASTATIN 40 MG PO TABS: 40.0000 mg | ORAL_TABLET | Freq: Every day | ORAL | Status: DC

## 2012-12-16 MED ORDER — OLMESARTAN MEDOXOMIL-HCTZ 40-12.5 MG PO TABS: ORAL_TABLET | ORAL | Status: DC

## 2012-12-16 NOTE — Progress Notes (Signed)
Subjective:    Patient ID: Zachary Miranda, male    DOB: 08-Sep-1950, 62 y.o.   MRN: [id number]  HPI HTN -  Pt denies chest pain, SOB, dizziness, or heart palpitations.  Taking meds as directed w/o problems.  Denies medication side effects. Not regular exercise.  Updates to family hx. + fam hx of heart disease. Last stress test was 10 years ago. Both brother dx with heart dz in mid-60s.   Hyperlipidemia-Tolerating statin well. No myalgias. No exercise.  Lab Results  Component Value Date   CHOL 174 07/06/2012   HDL 50 07/06/2012   LDLCALC 105* 07/06/2012   TRIG 95 07/06/2012   CHOLHDL 3.5 07/06/2012   Impaired fasting glucose-no increased thirst or urination.  Review of Systems BP 122/73  Pulse 66  Ht 5\' 10"  (1.778 m)  Wt 243 lb (110.224 kg)  BMI 34.87 kg/m2    Allergies  Allergen Reactions  . Atorvastatin     REACTION: myalgias  . Rosuvastatin     REACTION: cause myalgias    Past Medical History  Diagnosis Date  . Migraines     occular  . Hyperlipidemia   . Hypertension   . Allergy     Past Surgical History  Procedure Laterality Date  . Mandible surgery    . Lasik      History   Social History  . Marital Status: Married    Spouse Name: N/A    Number of Children: N/A  . Years of Education: N/A   Occupational History  . Not on file.   Social History Main Topics  . Smoking status: Never Smoker   . Smokeless tobacco: Never Used  . Alcohol Use: 3.0 oz/week    6 drink(s) per week  . Drug Use: No  . Sexually Active: Not on file   Other Topics Concern  . Not on file   Social History Narrative  . No narrative on file    Family History  Problem Relation Age of Onset  . Cancer Father     melanoma, died age 78  . Heart disease Father 5    MI  . Hyperlipidemia Sister   . Hyperlipidemia Brother   . Hypertension Brother   . Breast cancer Sister 30    died w/ recurrence  . Heart disease Brother   . Heart disease Brother 16  . Aortic aneurysm  Brother 66    correction   . Thyroid disease Mother 77    died 58    Outpatient Encounter Prescriptions as of 12/16/2012  Medication Sig Dispense Refill  . albuterol (PROAIR HFA) 108 (90 BASE) MCG/ACT inhaler Inhale 2 puffs into the lungs every 6 (six) hours as needed.      Marland Kitchen aspirin 325 MG tablet Take 325 mg by mouth daily.      . cholecalciferol (VITAMIN D) 1000 UNITS tablet Take 1,000 Units by mouth daily.      Marland Kitchen co-enzyme Q-10 30 MG capsule Take 30 mg by mouth 3 (three) times daily.      . Multiple Vitamins-Minerals (MULTIVITAMIN WITH MINERALS) tablet Take 1 tablet by mouth daily.      Marland Kitchen olmesartan-hydrochlorothiazide (BENICAR HCT) 40-12.5 MG per tablet TAKE 1 TABLET BY MOUTH DAILY.  90 tablet  0  . Omega-3 Fatty Acids (FISH OIL) 1000 MG CAPS Take by mouth.      . simvastatin (ZOCOR) 40 MG tablet Take 1 tablet (40 mg total) by mouth at bedtime.  90 tablet  1  . [  DISCONTINUED] BENICAR HCT 40-12.5 MG per tablet TAKE 1 TABLET BY MOUTH DAILY.  90 tablet  0  . [DISCONTINUED] simvastatin (ZOCOR) 40 MG tablet Take 1 tablet (40 mg total) by mouth at bedtime.  90 tablet  1  . [DISCONTINUED] simvastatin (ZOCOR) 40 MG tablet TAKE 1 TABLET (40 MG TOTAL) BY MOUTH AT BEDTIME.  90 tablet  1   No facility-administered encounter medications on file as of 12/16/2012.          Objective:   Physical Exam  Constitutional: He is oriented to person, place, and time. He appears well-developed and well-nourished.  HENT:  Head: Normocephalic and atraumatic.  Cardiovascular: Normal rate, regular rhythm and normal heart sounds.   Pulmonary/Chest: Effort normal and breath sounds normal.  Neurological: He is alert and oriented to person, place, and time.  Skin: Skin is warm and dry.  Psychiatric: He has a normal mood and affect. His behavior is normal.          Assessment & Plan:  HTN - Well controlled. Encourage regular exercise. He has a strong fam hx o heart disease. Encouraged him to think about  getting another stress test. His last one was about 10 years ago that was normal. If he would like to do this before starting an exercise regimen and I encouraged him to give me a call and we can order a treadmill stress test. Otherwise followup in 6 months.  Hyperlipidemia- not well controlled. continue current regime. Work on diet, weight loss, exercise.  Recheck at followup in 6 months.  Impaired fasting glucose-we'll repeat hemoglobin A1c today. His last was 6.1. We discussed what this means and the importance of following a low sugar diet as well as watching portion sizes on carbohydrates, getting regular exercise and weight loss.

## 2012-12-17 ENCOUNTER — Other Ambulatory Visit: Payer: Self-pay | Admitting: *Deleted

## 2012-12-17 DIAGNOSIS — R972 Elevated prostate specific antigen [PSA]: Secondary | ICD-10-CM

## 2013-03-17 ENCOUNTER — Other Ambulatory Visit: Payer: Self-pay | Admitting: Family Medicine

## 2013-05-11 ENCOUNTER — Encounter: Payer: Self-pay | Admitting: Family Medicine

## 2013-05-11 ENCOUNTER — Ambulatory Visit (INDEPENDENT_AMBULATORY_CARE_PROVIDER_SITE_OTHER): Payer: Managed Care, Other (non HMO) | Admitting: Family Medicine

## 2013-05-11 VITALS — BP 126/80 | HR 73 | Temp 97.3°F | Ht 70.0 in | Wt 242.0 lb

## 2013-05-11 DIAGNOSIS — R7301 Impaired fasting glucose: Secondary | ICD-10-CM

## 2013-05-11 DIAGNOSIS — R42 Dizziness and giddiness: Secondary | ICD-10-CM

## 2013-05-11 DIAGNOSIS — I10 Essential (primary) hypertension: Secondary | ICD-10-CM

## 2013-05-11 DIAGNOSIS — J3489 Other specified disorders of nose and nasal sinuses: Secondary | ICD-10-CM

## 2013-05-11 MED ORDER — AZITHROMYCIN 250 MG PO TABS: ORAL_TABLET | ORAL | Status: DC

## 2013-05-11 MED ORDER — BUTALBITAL-ASPIRIN-CAFFEINE 50-325-40 MG PO CAPS: 1.0000 | ORAL_CAPSULE | Freq: Two times a day (BID) | ORAL | Status: DC | PRN

## 2013-05-11 NOTE — Progress Notes (Signed)
  Subjective:    Patient ID: Zachary Miranda, male    DOB: [birth year], 62 y.o.   MRN: [id number]  HPI Impaired fasting glucose-reports his sugars have been more elevated recently. Last A1c was 6.1, not on any medication.  Hypertension-no chest pain, short of breath or palpitations. Felt dizzy and nauseated about 2 days ago. Thought is was some cinnamon scented pine cones from the grocery store and it was irritating his sinuses.   Thought maybe it was his blood pressure.  Took a Allegra bc his sinuses bothered him.  Took a meclizine this AM and felt better. Hx of migraines w/ vertigo as well when he gets really stressed. Has had some room spinning.  Felt better during the day but woke up with worse room spinning this AM.  Feels some congestion in th right forehead and right maxillary sinus.  No other URI sxs.  Using some saline in the nose.  Has had some tightness in his neck on th right side and has been seeing a [facility]. Last tx was about 1.5 weeks ago.  He has had some bloody discharge from the nose over the last week. He says sometimes this is not unusual with his allergies.  Hyperlipidemia-Well controlled.   Lab Results  Component Value Date   CHOL 174 07/06/2012   HDL 50 07/06/2012   LDLCALC 105* 07/06/2012   TRIG 95 07/06/2012   CHOLHDL 3.5 07/06/2012    Review of Systems     Objective:   Physical Exam  Constitutional: He is oriented to person, place, and time. He appears well-developed and well-nourished.  HENT:  Head: Normocephalic and atraumatic.  Right Ear: External ear normal.  Left Ear: External ear normal.  Nose: Nose normal.  Mouth/Throat: Oropharynx is clear and moist.  TMs and canals are clear.   Eyes: Conjunctivae and EOM are normal. Pupils are equal, round, and reactive to light.  Neck: Neck supple. No thyromegaly present.  Cardiovascular: Normal rate, regular rhythm and normal heart sounds.   Pulmonary/Chest: Effort normal and breath sounds normal.   Lymphadenopathy:    He has no cervical adenopathy.  Neurological: He is alert and oriented to person, place, and time. No cranial nerve deficit.  Negative Dix-Hallpike maneuver.  Skin: Skin is warm and dry.  Psychiatric: He has a normal mood and affect. His behavior is normal.          Assessment & Plan:  Impaired fasting glucose- A1C is 6.0, well controled. F/U in 6 months.    Hypertension-Well controlled today.    Vertigo-I. think this could be the same as his migraine variant he has experienced in the past. He does not typically get an actual headache with it. He has been under a lot more stress recently as his wife had an abnormal mammogram. Fortunately the biopsy has turned out normal which is fantastic. I would like for him to try Fiorinal and see if this provides relief. Also consider this could be due to formation of an early sinus infection since he primarily has pressure over the right frontal sinus and just under the right eye. He does have a history of allergic rhinitis and has been taking his antihistamines regularly. I did go ahead and get a prescription for azithromycin to fill over the holidays and we will be closed in case he feels that he is getting more infectious type symptoms such as fevers chills sweats or increased pain or discomfort or nasal discharge.

## 2013-06-12 ENCOUNTER — Other Ambulatory Visit: Payer: Self-pay | Admitting: Family Medicine

## 2013-06-13 ENCOUNTER — Other Ambulatory Visit: Payer: Self-pay | Admitting: Family Medicine

## 2013-06-18 ENCOUNTER — Encounter: Payer: Self-pay | Admitting: Family Medicine

## 2013-06-18 ENCOUNTER — Ambulatory Visit (INDEPENDENT_AMBULATORY_CARE_PROVIDER_SITE_OTHER): Payer: Managed Care, Other (non HMO) | Admitting: Family Medicine

## 2013-06-18 VITALS — BP 125/68 | HR 67 | Temp 97.5°F | Ht 70.0 in | Wt 241.0 lb

## 2013-06-18 DIAGNOSIS — I10 Essential (primary) hypertension: Secondary | ICD-10-CM

## 2013-06-18 DIAGNOSIS — E785 Hyperlipidemia, unspecified: Secondary | ICD-10-CM

## 2013-06-18 LAB — LIPID PANEL
CHOL/HDL RATIO: 3.4 ratio
CHOLESTEROL: 174 mg/dL (ref 0–200)
HDL: 51 mg/dL (ref >39)
LDL CALC: 99 mg/dL (ref 0–99)
Triglycerides: 120 mg/dL (ref <150)
VLDL: 24 mg/dL (ref 0–40)

## 2013-06-18 LAB — COMPLETE METABOLIC PANEL WITH GFR
ALBUMIN: 4.2 g/dL (ref 3.5–5.2)
ALK PHOS: 61 U/L (ref 39–117)
ALT: 19 U/L (ref 0–53)
AST: 19 U/L (ref 0–37)
BUN: 21 mg/dL (ref 6–23)
CALCIUM: 9.4 mg/dL (ref 8.4–10.5)
CHLORIDE: 106 meq/L (ref 96–112)
CO2: 27 mEq/L (ref 19–32)
Creat: 0.99 mg/dL (ref 0.50–1.35)
GFR, Est African American: >89 mL/min
GFR, Est Non African American: 81 mL/min
Glucose, Bld: 104 mg/dL — ABNORMAL HIGH (ref 70–99)
POTASSIUM: 4.3 meq/L (ref 3.5–5.3)
SODIUM: 142 meq/L (ref 135–145)
TOTAL PROTEIN: 6.5 g/dL (ref 6.0–8.3)
Total Bilirubin: 0.8 mg/dL (ref 0.3–1.2)

## 2013-06-18 MED ORDER — OLMESARTAN MEDOXOMIL-HCTZ 40-12.5 MG PO TABS: ORAL_TABLET | ORAL | Status: DC

## 2013-06-18 NOTE — Progress Notes (Signed)
   Subjective:    Patient ID: Zachary Miranda, male    DOB: 03-Feb-1951, 63 y.o.   MRN: [id number]  HPI  Hypertension - Pt denies chest pain, SOB, dizziness, or heart palpitations.  Taking meds as directed w/o problems.  Denies medication side effects.  Needs RF in Benicar  Hyprelipidemia - Due for repeat lipids. For about 6 weeks, he cut the simvastatin in half.  (total of 20mg ) daily. Would like to recheck lipids.  Lab Results  Component Value Date   CHOL 174 07/06/2012   HDL 50 07/06/2012   LDLCALC 105* 07/06/2012   TRIG 95 07/06/2012   CHOLHDL 3.5 07/06/2012       Review of Systems     Objective:   Physical Exam  Constitutional: He is oriented to person, place, and time. He appears well-developed and well-nourished.  HENT:  Head: Normocephalic and atraumatic.  Cardiovascular: Normal rate, regular rhythm and normal heart sounds.   Pulmonary/Chest: Effort normal and breath sounds normal.  Neurological: He is alert and oriented to person, place, and time.  Skin: Skin is warm and dry.  Psychiatric: He has a normal mood and affect. His behavior is normal.          Assessment & Plan:  HTN - Well controlled. F/U in 6 months.   Hyperlpidemia - recheck lipids and liver. Continue zocor. Will see if hald dose is adequete or not.

## 2013-06-20 NOTE — Progress Notes (Signed)
Quick Note:  All labs are normal. ______ 

## 2013-07-15 ENCOUNTER — Other Ambulatory Visit: Payer: Self-pay | Admitting: Family Medicine

## 2013-10-04 ENCOUNTER — Other Ambulatory Visit: Payer: Self-pay | Admitting: Family Medicine

## 2013-12-20 ENCOUNTER — Ambulatory Visit (INDEPENDENT_AMBULATORY_CARE_PROVIDER_SITE_OTHER): Payer: Managed Care, Other (non HMO) | Admitting: Family Medicine

## 2013-12-20 ENCOUNTER — Encounter: Payer: Self-pay | Admitting: Family Medicine

## 2013-12-20 VITALS — BP 103/61 | HR 71 | Ht 70.0 in | Wt 237.0 lb

## 2013-12-20 DIAGNOSIS — G5620 Lesion of ulnar nerve, unspecified upper limb: Secondary | ICD-10-CM | POA: Insufficient documentation

## 2013-12-20 DIAGNOSIS — R7301 Impaired fasting glucose: Secondary | ICD-10-CM | POA: Insufficient documentation

## 2013-12-20 DIAGNOSIS — R7309 Other abnormal glucose: Secondary | ICD-10-CM

## 2013-12-20 DIAGNOSIS — G5621 Lesion of ulnar nerve, right upper limb: Secondary | ICD-10-CM

## 2013-12-20 DIAGNOSIS — IMO0002 Reserved for concepts with insufficient information to code with codable children: Secondary | ICD-10-CM

## 2013-12-20 DIAGNOSIS — M7050 Other bursitis of knee, unspecified knee: Secondary | ICD-10-CM

## 2013-12-20 DIAGNOSIS — I10 Essential (primary) hypertension: Secondary | ICD-10-CM

## 2013-12-20 LAB — POCT GLYCOSYLATED HEMOGLOBIN (HGB A1C): HEMOGLOBIN A1C: 6.3

## 2013-12-20 MED ORDER — SIMVASTATIN 40 MG PO TABS: ORAL_TABLET | ORAL | Status: DC

## 2013-12-20 MED ORDER — OLMESARTAN MEDOXOMIL-HCTZ 40-12.5 MG PO TABS: ORAL_TABLET | ORAL | Status: DC

## 2013-12-20 NOTE — Progress Notes (Signed)
   Subjective:    Patient ID: Zachary Miranda, male    DOB: 1950-07-23, 63 y.o.   MRN: [id number]  HPI Hypertension- Pt denies chest pain, SOB, dizziness, or heart palpitations.  Taking meds as directed w/o problems.  Denies medication side effects.    IFG - no inc in thirst or urination.    Still getting numbness in right hand 3-5th digits at night. Occ during the daytime when rest arm on arm rst in the car.  Dx 10 years ago with EMG and MRI on the right side.  Gets numbness and burning in hand, worse at night. He is to wear a cockup splint on the wrist sometimes. He does see a chiropractor regularly and this does provide some temporary relief for him.  He also has some pain medially on the left knee. No worsening factors. He does sometimes when knee sleeve especially with exercise and that does seem to help a little bit. He says is just a soreness. He does not really take medication for it. No trauma or injury.  Review of Systems     Objective:   Physical Exam  Constitutional: He is oriented to person, place, and time. He appears well-developed and well-nourished.  HENT:  Head: Normocephalic and atraumatic.  Cardiovascular: Normal rate, regular rhythm and normal heart sounds.   Pulmonary/Chest: Effort normal and breath sounds normal.  Musculoskeletal:  Tender just below the medial joint line on th right knee.  NROM  Neurological: He is alert and oriented to person, place, and time.  Skin: Skin is warm and dry.  Psychiatric: He has a normal mood and affect. His behavior is normal.          Assessment & Plan:  HTN - well controlled.  Has lost 4 lbs. continue current regimen. Followup in 6 months. Continue with diet and weight loss.  Hyperlpidemia - well-controlled on current regimen. Labs up-to-date in January.  IFG - A1c is up today at 6.3 compared to previous of 6.1. Continue work on [facility]. He says he has been eating a lot of M&M slightly. Followup in 6  months.  Pes anserine bursitis, left knee-handout provided with home physical therapy exercises. If he's not improving then please let us know.  Ulnar neuropathy-we discussed options including conservative care such as physical therapy and possibly injections versus moving forward with surgery. He's had symptoms for at least 10 years at this point in time. There have been getting progressively worse. It's now waking him up at night. He will let me know if he would like to move forward with MRI or consult.

## 2013-12-21 LAB — BASIC METABOLIC PANEL WITH GFR
BUN: 21 mg/dL (ref 6–23)
CALCIUM: 9.3 mg/dL (ref 8.4–10.5)
CO2: 25 meq/L (ref 19–32)
CREATININE: 1.04 mg/dL (ref 0.50–1.35)
Chloride: 106 mEq/L (ref 96–112)
GFR, Est African American: 87 mL/min
GFR, Est Non African American: 76 mL/min
Glucose, Bld: 115 mg/dL — ABNORMAL HIGH (ref 70–99)
Potassium: 4.5 mEq/L (ref 3.5–5.3)
SODIUM: 140 meq/L (ref 135–145)

## 2013-12-21 NOTE — Progress Notes (Signed)
Quick Note:  All labs are normal. ______ 

## 2014-03-15 ENCOUNTER — Telehealth: Payer: Self-pay | Admitting: Family Medicine

## 2014-03-15 MED ORDER — VALSARTAN-HYDROCHLOROTHIAZIDE 320-12.5 MG PO TABS: 1.0000 | ORAL_TABLET | Freq: Every day | ORAL | Status: DC

## 2014-03-15 NOTE — Telephone Encounter (Signed)
Please call patient and let him know that his Benicar will no longer be covered on his insurance policy. I will switch him to generic Diovan HCT in its place. New prescriptions sent to [facility]. For him that if he has any problems at the pharmacy to give [place] a call back.

## 2014-03-15 NOTE — Telephone Encounter (Signed)
Pt notified of new rx.

## 2014-05-21 ENCOUNTER — Other Ambulatory Visit: Payer: Self-pay | Admitting: Family Medicine

## 2014-05-26 DIAGNOSIS — G5601 Carpal tunnel syndrome, right upper limb: Secondary | ICD-10-CM | POA: Insufficient documentation

## 2014-06-20 DIAGNOSIS — I10 Essential (primary) hypertension: Secondary | ICD-10-CM | POA: Insufficient documentation

## 2014-06-20 DIAGNOSIS — R7303 Prediabetes: Secondary | ICD-10-CM | POA: Insufficient documentation

## 2014-06-20 DIAGNOSIS — M1990 Unspecified osteoarthritis, unspecified site: Secondary | ICD-10-CM | POA: Insufficient documentation

## 2014-06-23 ENCOUNTER — Ambulatory Visit: Payer: Managed Care, Other (non HMO) | Admitting: Family Medicine

## 2014-06-24 ENCOUNTER — Encounter: Payer: Self-pay | Admitting: Family Medicine

## 2014-06-24 ENCOUNTER — Ambulatory Visit (INDEPENDENT_AMBULATORY_CARE_PROVIDER_SITE_OTHER): Payer: Managed Care, Other (non HMO) | Admitting: Family Medicine

## 2014-06-24 VITALS — BP 129/72 | HR 66 | Ht 70.0 in | Wt 239.0 lb

## 2014-06-24 DIAGNOSIS — E785 Hyperlipidemia, unspecified: Secondary | ICD-10-CM

## 2014-06-24 DIAGNOSIS — I10 Essential (primary) hypertension: Secondary | ICD-10-CM

## 2014-06-24 DIAGNOSIS — R7301 Impaired fasting glucose: Secondary | ICD-10-CM

## 2014-06-24 LAB — COMPLETE METABOLIC PANEL WITH GFR
ALT: 19 U/L (ref 0–53)
AST: 21 U/L (ref 0–37)
Albumin: 4.6 g/dL (ref 3.5–5.2)
Alkaline Phosphatase: 61 U/L (ref 39–117)
BUN: 18 mg/dL (ref 6–23)
CO2: 29 mEq/L (ref 19–32)
Calcium: 9.8 mg/dL (ref 8.4–10.5)
Chloride: 104 mEq/L (ref 96–112)
Creat: 1.02 mg/dL (ref 0.50–1.35)
GFR, EST NON AFRICAN AMERICAN: 77 mL/min
GFR, Est African American: 89 mL/min
Glucose, Bld: 110 mg/dL — ABNORMAL HIGH (ref 70–99)
Potassium: 4.8 mEq/L (ref 3.5–5.3)
Sodium: 141 mEq/L (ref 135–145)
TOTAL PROTEIN: 6.9 g/dL (ref 6.0–8.3)
Total Bilirubin: 1 mg/dL (ref 0.2–1.2)

## 2014-06-24 LAB — LIPID PANEL
Cholesterol: 177 mg/dL (ref 0–200)
HDL: 61 mg/dL (ref >39)
LDL CALC: 97 mg/dL (ref 0–99)
Total CHOL/HDL Ratio: 2.9 Ratio
Triglycerides: 95 mg/dL (ref <150)
VLDL: 19 mg/dL (ref 0–40)

## 2014-06-24 LAB — POCT GLYCOSYLATED HEMOGLOBIN (HGB A1C): Hemoglobin A1C: 6.2

## 2014-06-24 MED ORDER — VALSARTAN-HYDROCHLOROTHIAZIDE 320-12.5 MG PO TABS: 1.0000 | ORAL_TABLET | Freq: Every day | ORAL | Status: DC

## 2014-06-24 MED ORDER — SIMVASTATIN 20 MG PO TABS: 20.0000 mg | ORAL_TABLET | Freq: Every day | ORAL | Status: DC

## 2014-06-24 NOTE — Progress Notes (Signed)
   Subjective:    Patient ID: Zachary Miranda, male    DOB: 13-Sep-1950, 64 y.o.   MRN: [id number]  HPI Hypertension- Pt denies chest pain, SOB, dizziness, or heart palpitations.  Taking meds as directed w/o problems.  Denies medication side effects.    IFG - no increased thirst or urination.  Hyperlipidemia-he's actually been splitting his simvastatin so he's only been taking 20 mg for the last year. He otherwise tolerates it well. Denies any myalgias or side effects.  Has carpel tunnel surgery next Tuesday on his right wrist.  Now having pain in his knee. Seeing ortho next week.   Review of Systems     Objective:   Physical Exam  Constitutional: He is oriented to person, place, and time. He appears well-developed and well-nourished.  HENT:  Head: Normocephalic and atraumatic.  Cardiovascular: Normal rate, regular rhythm and normal heart sounds.   Pulmonary/Chest: Effort normal and breath sounds normal.  Neurological: He is alert and oriented to person, place, and time.  Skin: Skin is warm and dry.  Psychiatric: He has a normal mood and affect. His behavior is normal.          Assessment & Plan:  HTN- well controlled on current regimen. Due for CMP and lipid panel. Follow-up in 6 months. Also due for screening prostate test.  IFG- A1c of 6.2 today. This is actually down a little bit from 6 months ago.  Hyperlipidemia-we discussed that based on the new guidelines it's highly recommended that he be on a moderate dose statin which will be a full 40 mg of his simvastatin. He seems a bit hesitant about this. He did not have any side effects on the 40 mg when he took it previously. Encouraged him to think about it especially with his family history of heart disease.

## 2014-06-25 LAB — PSA: PSA: 1.23 ng/mL (ref <=4.00)

## 2014-06-26 NOTE — Progress Notes (Signed)
Quick Note:  All labs are normal. ______ 

## 2014-08-11 ENCOUNTER — Other Ambulatory Visit: Payer: Self-pay | Admitting: Family Medicine

## 2014-08-12 NOTE — Telephone Encounter (Signed)
Due for F/u around July

## 2014-11-19 ENCOUNTER — Other Ambulatory Visit: Payer: Self-pay | Admitting: Family Medicine

## 2014-12-12 ENCOUNTER — Other Ambulatory Visit: Payer: Self-pay

## 2014-12-23 ENCOUNTER — Encounter: Payer: Self-pay | Admitting: Family Medicine

## 2014-12-23 ENCOUNTER — Ambulatory Visit (INDEPENDENT_AMBULATORY_CARE_PROVIDER_SITE_OTHER): Payer: Medicare Other | Admitting: Family Medicine

## 2014-12-23 VITALS — BP 119/73 | HR 77 | Ht 70.0 in | Wt 245.0 lb

## 2014-12-23 DIAGNOSIS — Z114 Encounter for screening for human immunodeficiency virus [HIV]: Secondary | ICD-10-CM

## 2014-12-23 DIAGNOSIS — Z1159 Encounter for screening for other viral diseases: Secondary | ICD-10-CM | POA: Diagnosis not present

## 2014-12-23 DIAGNOSIS — L819 Disorder of pigmentation, unspecified: Secondary | ICD-10-CM

## 2014-12-23 DIAGNOSIS — R7301 Impaired fasting glucose: Secondary | ICD-10-CM

## 2014-12-23 DIAGNOSIS — Z23 Encounter for immunization: Secondary | ICD-10-CM

## 2014-12-23 DIAGNOSIS — I10 Essential (primary) hypertension: Secondary | ICD-10-CM

## 2014-12-23 LAB — BASIC METABOLIC PANEL
BUN: 17 mg/dL (ref 6–23)
CALCIUM: 9.5 mg/dL (ref 8.4–10.5)
CO2: 28 mEq/L (ref 19–32)
Chloride: 103 mEq/L (ref 96–112)
Creat: 0.99 mg/dL (ref 0.50–1.35)
Glucose, Bld: 102 mg/dL — ABNORMAL HIGH (ref 70–99)
Potassium: 4.5 mEq/L (ref 3.5–5.3)
SODIUM: 142 meq/L (ref 135–145)

## 2014-12-23 LAB — HIV ANTIBODY (ROUTINE TESTING W REFLEX): HIV 1&2 Ab, 4th Generation: NONREACTIVE

## 2014-12-23 LAB — POCT GLYCOSYLATED HEMOGLOBIN (HGB A1C): Hemoglobin A1C: 6.2

## 2014-12-23 MED ORDER — VALSARTAN-HYDROCHLOROTHIAZIDE 320-12.5 MG PO TABS: 1.0000 | ORAL_TABLET | Freq: Every day | ORAL | Status: DC

## 2014-12-23 MED ORDER — SIMVASTATIN 20 MG PO TABS: ORAL_TABLET | ORAL | Status: DC

## 2014-12-23 NOTE — Progress Notes (Signed)
   Subjective:    Patient ID: Zachary Miranda, male    DOB: [birth year], 64 y.o.   MRN: [id number]  HPI Hypertension- Pt denies chest pain, SOB, dizziness, or heart palpitations.  Taking meds as directed w/o problems.  Denies medication side effects.    IFG - no inc thirst or urination. Says he know he should lose some weight.   About 2 months ago got into a briar bush and his arm blistered up near the left elbow. Has been using neosporin and says it finally is better. Now has some hyperpigmentation.   Review of Systems     Objective:   Physical Exam  Constitutional: He is oriented to person, place, and time. He appears well-developed and well-nourished.  HENT:  Head: Normocephalic and atraumatic.  Cardiovascular: Normal rate, regular rhythm and normal heart sounds.   Pulmonary/Chest: Effort normal and breath sounds normal.  Neurological: He is alert and oriented to person, place, and time.  Skin: Skin is warm and dry.  Psychiatric: He has a normal mood and affect. His behavior is normal.    Some hyperpigmentation on the outer left elbow.        Assessment & Plan:  HTN - well controlled. F/U in 6 months.    IFG - Stable, A1c of 6.3 today. Work on diet and exercise. Discussed he is very close to have diabetes.   Discussed need for pneumonia vaccine. Given Prevnar 13 today.    Skin hyperpigmentation - Are on left elbow is healed.  Hyperpigmentation will likely fade over months to year.    Discussed living will today.

## 2014-12-23 NOTE — Addendum Note (Signed)
Addended by: Teddy Spike on: 12/23/2014 09:02 AM   Modules accepted: Orders, Level of Service

## 2014-12-24 LAB — HEPATITIS C ANTIBODY: HCV Ab: NEGATIVE

## 2015-04-12 DIAGNOSIS — M25562 Pain in left knee: Secondary | ICD-10-CM | POA: Diagnosis not present

## 2015-04-12 DIAGNOSIS — M179 Osteoarthritis of knee, unspecified: Secondary | ICD-10-CM | POA: Diagnosis not present

## 2015-04-12 DIAGNOSIS — M1712 Unilateral primary osteoarthritis, left knee: Secondary | ICD-10-CM | POA: Diagnosis not present

## 2015-04-15 ENCOUNTER — Other Ambulatory Visit: Payer: Self-pay | Admitting: Family Medicine

## 2015-06-23 ENCOUNTER — Encounter: Payer: Medicare Other | Admitting: Family Medicine

## 2015-07-04 ENCOUNTER — Encounter: Payer: Self-pay | Admitting: Family Medicine

## 2015-07-04 ENCOUNTER — Ambulatory Visit (INDEPENDENT_AMBULATORY_CARE_PROVIDER_SITE_OTHER): Payer: Medicare Other | Admitting: Family Medicine

## 2015-07-04 VITALS — BP 137/87 | HR 65 | Wt 237.0 lb

## 2015-07-04 DIAGNOSIS — R7301 Impaired fasting glucose: Secondary | ICD-10-CM

## 2015-07-04 DIAGNOSIS — I10 Essential (primary) hypertension: Secondary | ICD-10-CM

## 2015-07-04 DIAGNOSIS — Z0000 Encounter for general adult medical examination without abnormal findings: Secondary | ICD-10-CM | POA: Diagnosis not present

## 2015-07-04 DIAGNOSIS — Z125 Encounter for screening for malignant neoplasm of prostate: Secondary | ICD-10-CM

## 2015-07-04 LAB — COMPLETE METABOLIC PANEL WITH GFR
ALBUMIN: 4.4 g/dL (ref 3.6–5.1)
ALT: 17 U/L (ref 9–46)
AST: 20 U/L (ref 10–35)
Alkaline Phosphatase: 53 U/L (ref 40–115)
BUN: 15 mg/dL (ref 7–25)
CO2: 28 mmol/L (ref 20–31)
Calcium: 9.2 mg/dL (ref 8.6–10.3)
Chloride: 104 mmol/L (ref 98–110)
Creat: 1.01 mg/dL (ref 0.70–1.25)
GFR, Est African American: >89 mL/min (ref >=60)
GFR, Est Non African American: 78 mL/min (ref >=60)
GLUCOSE: 101 mg/dL — AB (ref 65–99)
Potassium: 4.3 mmol/L (ref 3.5–5.3)
SODIUM: 141 mmol/L (ref 135–146)
Total Bilirubin: 0.8 mg/dL (ref 0.2–1.2)
Total Protein: 6.4 g/dL (ref 6.1–8.1)

## 2015-07-04 LAB — LIPID PANEL
CHOL/HDL RATIO: 3 ratio (ref <=5.0)
Cholesterol: 170 mg/dL (ref 125–200)
HDL: 57 mg/dL (ref >=40)
LDL Cholesterol: 94 mg/dL (ref <130)
Triglycerides: 94 mg/dL (ref <150)
VLDL: 19 mg/dL (ref <30)

## 2015-07-04 LAB — POCT GLYCOSYLATED HEMOGLOBIN (HGB A1C): HEMOGLOBIN A1C: 6.3

## 2015-07-04 MED ORDER — SIMVASTATIN 20 MG PO TABS: ORAL_TABLET | ORAL | Status: DC

## 2015-07-04 MED ORDER — VALSARTAN-HYDROCHLOROTHIAZIDE 320-12.5 MG PO TABS: ORAL_TABLET | ORAL | Status: DC

## 2015-07-04 NOTE — Progress Notes (Signed)
Subjective:    Zachary Miranda is a 65 y.o. male who presents for Medicare Annual/Subsequent preventive examination.   Preventive Screening-Counseling & Management  Tobacco History  Smoking status  . Never Smoker   Smokeless tobacco  . Never Used    Problems Prior to Visit 1.   Current Problems (verified) Patient Active Problem List   Diagnosis Date Noted  . Neuropathy, ulnar nerve 12/20/2013  . IFG (impaired fasting glucose) 12/20/2013  . Hyperlipidemia LDL goal <100 03/25/2006  . HYPERTENSION, BENIGN SYSTEMIC 03/25/2006  . RHINITIS, ALLERGIC 03/25/2006  . GASTROESOPHAGEAL REFLUX, NO ESOPHAGITIS 03/25/2006    Medications Prior to Visit Current Outpatient Prescriptions on File Prior to Visit  Medication Sig Dispense Refill  . aspirin 81 MG tablet Take 81 mg by mouth daily.    . cholecalciferol (VITAMIN D) 1000 UNITS tablet Take 1,000 Units by mouth daily.    Marland Kitchen co-enzyme Q-10 30 MG capsule Take 30 mg by mouth 3 (three) times daily.    . Multiple Vitamins-Minerals (MULTIVITAMIN WITH MINERALS) tablet Take 1 tablet by mouth daily.    . Omega-3 Fatty Acids (FISH OIL) 1000 MG CAPS Take by mouth.     No current facility-administered medications on file prior to visit.    Current Medications (verified) Current Outpatient Prescriptions  Medication Sig Dispense Refill  . aspirin 81 MG tablet Take 81 mg by mouth daily.    . cholecalciferol (VITAMIN D) 1000 UNITS tablet Take 1,000 Units by mouth daily.    Marland Kitchen co-enzyme Q-10 30 MG capsule Take 30 mg by mouth 3 (three) times daily.    . Multiple Vitamins-Minerals (MULTIVITAMIN WITH MINERALS) tablet Take 1 tablet by mouth daily.    . Omega-3 Fatty Acids (FISH OIL) 1000 MG CAPS Take by mouth.    . simvastatin (ZOCOR) 20 MG tablet Take 1 tablet by mouth  daily 90 tablet 3  . valsartan-hydrochlorothiazide (DIOVAN-HCT) 320-12.5 MG tablet Take 1 tablet by mouth  daily 90 tablet 1   No current facility-administered medications for  this visit.     Allergies (verified) Atorvastatin and Rosuvastatin   PAST HISTORY  Family History Family History  Problem Relation Age of Onset  . Cancer Father     melanoma, died age 44  . Heart disease Father 19    MI  . Hyperlipidemia Sister   . Hyperlipidemia Brother   . Hypertension Brother   . Breast cancer Sister 71    died w/ recurrence  . Heart disease Brother   . Heart disease Brother 43  . Aortic aneurysm Brother 36    correction   . Thyroid disease Mother 56    died 42  . Alzheimer's disease Brother     Social History Social History  Substance Use Topics  . Smoking status: Never Smoker   . Smokeless tobacco: Never Used  . Alcohol Use: 3.0 oz/week    6 drink(s) per week    Are there smokers in your home (other than you)?  No  Risk Factors Current exercise habits: gym 3 days pwer week  Dietary issues discussed: None  Cardiac risk factors: advanced age (older than 35 for men, 74 for women), hypertension, male gender, obesity (BMI >= 30 kg/m2) and sedentary lifestyle.  Depression Screen (Note: if answer to either of the following is "Yes", a more complete depression screening is indicated)   Q1: Over the past two weeks, have you felt down, depressed or hopeless? No  Q2: Over the past two weeks, have you  felt little interest or pleasure in doing things? No  Have you lost interest or pleasure in daily life? No  Do you often feel hopeless? No  Do you cry easily over simple problems? No  Activities of Daily Living In your present state of health, do you have any difficulty performing the following activities?:  Driving? No Managing money?  No Feeding yourself? No Getting from bed to chair? No Climbing a flight of stairs? No Preparing food and eating?: No Bathing or showering? No Getting dressed: No Getting to the toilet? No Using the toilet:No Moving around from place to place: No In the past year have you fallen or had a near fall?:No   Are  you sexually active?  Yes  Do you have more than one partner?  No  Hearing Difficulties: No Do you often ask people to speak up or repeat themselves? No Do you experience ringing or noises in your ears? No Do you have difficulty understanding soft or whispered voices? No   Do you feel that you have a problem with memory? No  Do you often misplace items? No  Do you feel safe at home?  Yes  Cognitive Testing  Alert? Yes  Normal Appearance?Yes  Oriented to person? Yes  Place? No   Time? Yes  Recall of three objects?  Yes  Can perform simple calculations? Yes  Displays appropriate judgment?Yes  Can read the correct time from a watch face?Yes    6 CIT score of 0/28 - Normal.     Advanced Directives have been discussed with the patient? Yes   List the Names of Other Physician/Practitioners you currently use: 1.    Indicate any recent Medical Services you may have received from other than [facility] providers in the past year (date may be approximate).  Immunization History  Administered Date(s) Administered  . Influenza Split 03/23/2012  . Influenza Whole 04/12/2009, 02/25/2011  . Influenza-Unspecified 03/21/2013  . Pneumococcal Conjugate-13 12/23/2014  . Td 10/08/2005  . Zoster 02/21/2011    Screening Tests Health Maintenance  Topic Date Due  . INFLUENZA VACCINE  01/16/2015  . TETANUS/TDAP  10/09/2015  . PNA vac Low Risk Adult (2 of 2 - PPSV23) 12/23/2015  . COLONOSCOPY  05/22/2016  . ZOSTAVAX  Completed  . Hepatitis C Screening  Completed  . HIV Screening  Completed    All answers were reviewed with the patient and necessary referrals were made:  METHENEY,CATHERINE, MD   07/04/2015   History reviewed: allergies, current medications, past family history, past medical history, past social history, past surgical history and problem list  Review of Systems Pertinent items noted in HPI and remainder of comprehensive ROS otherwise negative.     Objective:     Vision by  Snellen chart: right eye:20/25, left eye:20/30 Blood pressure 137/87, pulse 65, weight 237 lb (107.502 kg), SpO2 97 %. Body mass index is 34.01 kg/(m^2).  General appearance: alert, cooperative and appears stated age Head: Normocephalic, without obvious abnormality, atraumatic Eyes: conj clear EOMI, PEERLA Ears: normal TM's and external ear canals both ears Nose: Nares normal. Septum midline. Mucosa normal. No drainage or sinus tenderness. Throat: lips, mucosa, and tongue normal; teeth and gums normal Neck: no adenopathy, no carotid bruit, no JVD, supple, symmetrical, trachea midline and thyroid not enlarged, symmetric, no tenderness/mass/nodules Back: symmetric, no curvature. ROM normal. No CVA tenderness. Lungs: clear to auscultation bilaterally Chest wall: no tenderness Heart: regular rate and rhythm, S1, S2 normal, no murmur, click, rub or gallop  Abdomen: soft, non-tender; bowel sounds normal; no masses,  no organomegaly Extremities: extremities normal, atraumatic, no cyanosis or edema Pulses: 2+ and symmetric Skin: Skin color, texture, turgor normal. No rashes or lesions Lymph nodes: Cervical, supraclavicular, and axillary nodes normal. Neurologic: Alert and oriented X 3, normal strength and tone. Normal symmetric reflexes. Normal coordination and gait     Assessment:     Medicare Wellness Exam       Plan:     During the course of the visit the patient was educated and counseled about appropriate screening and preventive services including:    Colorectal cancer screening - due at the end of the year  tdap - due in April  Labs done today  HTN/ IFG - stable. F/U in 6 months.    Diet review for nutrition referral? Yes ____  Not Indicated __X_   Patient Instructions (the written plan) was given to the patient.  Medicare Attestation I have personally reviewed: The patient's medical and social history Their use of alcohol, tobacco or illicit drugs Their current  medications and supplements The patient's functional ability including ADLs,fall risks, home safety risks, cognitive, and hearing and visual impairment Diet and physical activities Evidence for depression or mood disorders  The patient's weight, height, BMI, and visual acuity have been recorded in the chart.  I have made referrals, counseling, and provided education to the patient based on review of the above and I have provided the patient with a written personalized care plan for preventive services.     METHENEY,CATHERINE, MD   07/04/2015

## 2015-07-05 LAB — PSA: PSA: 1.39 ng/mL (ref <=4.00)

## 2015-08-22 DIAGNOSIS — H524 Presbyopia: Secondary | ICD-10-CM | POA: Diagnosis not present

## 2015-08-22 DIAGNOSIS — H43811 Vitreous degeneration, right eye: Secondary | ICD-10-CM | POA: Diagnosis not present

## 2015-12-02 ENCOUNTER — Other Ambulatory Visit: Payer: Self-pay | Admitting: Family Medicine

## 2016-01-01 ENCOUNTER — Ambulatory Visit: Payer: Medicare Other | Admitting: Family Medicine

## 2016-01-10 ENCOUNTER — Ambulatory Visit (INDEPENDENT_AMBULATORY_CARE_PROVIDER_SITE_OTHER): Payer: Medicare Other | Admitting: Family Medicine

## 2016-01-10 ENCOUNTER — Encounter: Payer: Self-pay | Admitting: Family Medicine

## 2016-01-10 VITALS — BP 114/69 | HR 74 | Temp 98.2°F | Ht 70.0 in | Wt 236.0 lb

## 2016-01-10 DIAGNOSIS — R7301 Impaired fasting glucose: Secondary | ICD-10-CM | POA: Diagnosis not present

## 2016-01-10 DIAGNOSIS — I10 Essential (primary) hypertension: Secondary | ICD-10-CM | POA: Diagnosis not present

## 2016-01-10 DIAGNOSIS — Z23 Encounter for immunization: Secondary | ICD-10-CM

## 2016-01-10 LAB — BASIC METABOLIC PANEL
BUN: 17 mg/dL (ref 7–25)
CHLORIDE: 107 mmol/L (ref 98–110)
CO2: 24 mmol/L (ref 20–31)
CREATININE: 0.99 mg/dL (ref 0.70–1.25)
Calcium: 9 mg/dL (ref 8.6–10.3)
Glucose, Bld: 109 mg/dL — ABNORMAL HIGH (ref 65–99)
Potassium: 4.1 mmol/L (ref 3.5–5.3)
SODIUM: 142 mmol/L (ref 135–146)

## 2016-01-10 LAB — POCT GLYCOSYLATED HEMOGLOBIN (HGB A1C): HEMOGLOBIN A1C: 6.2

## 2016-01-10 MED ORDER — AMBULATORY NON FORMULARY MEDICATION: 0 refills | Status: DC

## 2016-01-10 NOTE — Progress Notes (Signed)
Subjective:    CC: HTN, IFG   HPI: Hypertension- Pt denies chest pain, SOB, dizziness, or heart palpitations.  Taking meds as directed w/o problems.  Denies medication side effects.    IFG - no inc thirst or urination. Hasn't been working out at much as he was.    Past medical history, Surgical history, Family history not pertinant except as noted below, Social history, Allergies, and medications have been entered into the medical record, reviewed, and corrections made.   Review of Systems: No fevers, chills, night sweats, weight loss, chest pain, or shortness of breath.   Objective:    General: Well Developed, well nourished, and in no acute distress.  Neuro: Alert and oriented x3, extra-ocular muscles intact, sensation grossly intact.  HEENT: Normocephalic, atraumatic  Skin: Warm and dry, no rashes. Cardiac: Regular rate and rhythm, no murmurs rubs or gallops, no lower extremity edema.  Respiratory: Clear to auscultation bilaterally. Not using accessory muscles, speaking in full sentences.   Impression and Recommendations:   HTN - Well controlled. Continue current regimen. Follow up in  6 mo.   IFG -  Hemoglobin A1c of 6.2 today. Continue to monitor. Recheck in 6-12 months.   REminded due for Tdap and Pneumo 23.

## 2016-01-11 DIAGNOSIS — G8929 Other chronic pain: Secondary | ICD-10-CM | POA: Diagnosis not present

## 2016-01-11 DIAGNOSIS — M25562 Pain in left knee: Secondary | ICD-10-CM | POA: Diagnosis not present

## 2016-01-11 DIAGNOSIS — M2392 Unspecified internal derangement of left knee: Secondary | ICD-10-CM | POA: Diagnosis not present

## 2016-01-11 DIAGNOSIS — Z7982 Long term (current) use of aspirin: Secondary | ICD-10-CM | POA: Diagnosis not present

## 2016-01-11 NOTE — Progress Notes (Signed)
All labs are normal. 

## 2016-03-29 DIAGNOSIS — M2392 Unspecified internal derangement of left knee: Secondary | ICD-10-CM | POA: Diagnosis not present

## 2016-03-29 DIAGNOSIS — M25562 Pain in left knee: Secondary | ICD-10-CM | POA: Diagnosis not present

## 2016-04-10 ENCOUNTER — Other Ambulatory Visit: Payer: Self-pay | Admitting: Family Medicine

## 2016-04-10 DIAGNOSIS — S83242A Other tear of medial meniscus, current injury, left knee, initial encounter: Secondary | ICD-10-CM | POA: Diagnosis not present

## 2016-04-10 DIAGNOSIS — M1712 Unilateral primary osteoarthritis, left knee: Secondary | ICD-10-CM | POA: Diagnosis not present

## 2016-04-22 DIAGNOSIS — M1712 Unilateral primary osteoarthritis, left knee: Secondary | ICD-10-CM | POA: Diagnosis not present

## 2016-06-28 DIAGNOSIS — Z1211 Encounter for screening for malignant neoplasm of colon: Secondary | ICD-10-CM | POA: Diagnosis not present

## 2016-06-28 DIAGNOSIS — D122 Benign neoplasm of ascending colon: Secondary | ICD-10-CM | POA: Diagnosis not present

## 2016-06-28 LAB — HM COLONOSCOPY

## 2016-07-04 ENCOUNTER — Encounter: Payer: Self-pay | Admitting: Family Medicine

## 2016-07-08 ENCOUNTER — Telehealth: Payer: Self-pay | Admitting: Family Medicine

## 2016-07-08 NOTE — Telephone Encounter (Signed)
Lm fot pt to schedule AWV with Carla on 1/25 at 8:30 after F/U with Dr. Madilyn Fireman. I went ahead and scheduled the appointment, if he declines, please cancel, mn

## 2016-07-11 ENCOUNTER — Ambulatory Visit (INDEPENDENT_AMBULATORY_CARE_PROVIDER_SITE_OTHER): Payer: Medicare Other | Admitting: Family Medicine

## 2016-07-11 ENCOUNTER — Encounter: Payer: Self-pay | Admitting: Family Medicine

## 2016-07-11 ENCOUNTER — Ambulatory Visit: Payer: Medicare Other

## 2016-07-11 VITALS — BP 133/65 | HR 64 | Ht 70.0 in | Wt 230.0 lb

## 2016-07-11 VITALS — BP 133/65 | HR 64 | Temp 97.7°F | Resp 16 | Ht 70.0 in | Wt 230.0 lb

## 2016-07-11 DIAGNOSIS — E785 Hyperlipidemia, unspecified: Secondary | ICD-10-CM

## 2016-07-11 DIAGNOSIS — I10 Essential (primary) hypertension: Secondary | ICD-10-CM

## 2016-07-11 DIAGNOSIS — Z125 Encounter for screening for malignant neoplasm of prostate: Secondary | ICD-10-CM | POA: Diagnosis not present

## 2016-07-11 DIAGNOSIS — R7301 Impaired fasting glucose: Secondary | ICD-10-CM | POA: Diagnosis not present

## 2016-07-11 DIAGNOSIS — M7702 Medial epicondylitis, left elbow: Secondary | ICD-10-CM

## 2016-07-11 DIAGNOSIS — Z0000 Encounter for general adult medical examination without abnormal findings: Secondary | ICD-10-CM

## 2016-07-11 LAB — COMPLETE METABOLIC PANEL WITH GFR
ALT: 16 U/L (ref 9–46)
AST: 19 U/L (ref 10–35)
Albumin: 4.3 g/dL (ref 3.6–5.1)
Alkaline Phosphatase: 71 U/L (ref 40–115)
BUN: 16 mg/dL (ref 7–25)
CALCIUM: 9.8 mg/dL (ref 8.6–10.3)
CHLORIDE: 104 mmol/L (ref 98–110)
CO2: 23 mmol/L (ref 20–31)
CREATININE: 0.89 mg/dL (ref 0.70–1.25)
GFR, Est African American: >89 mL/min (ref >=60)
GFR, Est Non African American: 89 mL/min (ref >=60)
Glucose, Bld: 103 mg/dL — ABNORMAL HIGH (ref 65–99)
POTASSIUM: 4.7 mmol/L (ref 3.5–5.3)
SODIUM: 140 mmol/L (ref 135–146)
Total Bilirubin: 0.8 mg/dL (ref 0.2–1.2)
Total Protein: 6.9 g/dL (ref 6.1–8.1)

## 2016-07-11 LAB — LIPID PANEL
CHOL/HDL RATIO: 3 ratio (ref <5.0)
CHOLESTEROL: 170 mg/dL (ref <200)
HDL: 57 mg/dL (ref >40)
LDL CALC: 94 mg/dL (ref <100)
Triglycerides: 93 mg/dL (ref <150)
VLDL: 19 mg/dL (ref <30)

## 2016-07-11 LAB — POCT GLYCOSYLATED HEMOGLOBIN (HGB A1C): HEMOGLOBIN A1C: 6.4

## 2016-07-11 MED ORDER — VALSARTAN-HYDROCHLOROTHIAZIDE 320-12.5 MG PO TABS: 1.0000 | ORAL_TABLET | Freq: Every day | ORAL | 1 refills | Status: DC

## 2016-07-11 MED ORDER — SIMVASTATIN 20 MG PO TABS: 20.0000 mg | ORAL_TABLET | Freq: Every day | ORAL | 1 refills | Status: DC

## 2016-07-11 NOTE — Progress Notes (Signed)
Subjective:    CC: HTN  HPI:  Hypertension- Pt denies chest pain, SOB, dizziness, or heart palpitations.  Taking meds as directed w/o problems.  Denies medication side effects.    IFG -  No increased thirst or urination.  Left elbow pain - he complains of left inner elbow pain is been going on for several weeks. He denies any known injury or trauma. He says he notices it more when he starts to push something to the side not so much with taking them up. If they can typically find a little tender spot if he presses on it. No swelling. No limitation of activity.  Past medical history, Surgical history, Family history not pertinant except as noted below, Social history, Allergies, and medications have been entered into the medical record, reviewed, and corrections made.   Review of Systems: No fevers, chills, night sweats, weight loss, chest pain, or shortness of breath.   Objective:    General: Well Developed, well nourished, and in no acute distress.  Neuro: Alert and oriented x3, extra-ocular muscles intact, sensation grossly intact.  HEENT: Normocephalic, atraumatic  Skin: Warm and dry, no rashes. Cardiac: Regular rate and rhythm, no murmurs rubs or gallops, no lower extremity edema.  Respiratory: Clear to auscultation bilaterally. Not using accessory muscles, speaking in full sentences.   Impression and Recommendations:    HTN - Well controlled. Continue current regimen. Follow-up in 6 months.  IFG - hemoglobin A1c up from previous. Very close to a diagnosis of diabetes and we discussed this today. Recommended that he consider metformin at this stage to delay the onset of full-blown diabetes. He says he really wants to work on diet and exercise but I did encourage him to at least think about it. Like to see him back in 3-6 months to make sure these doing this back under control.  Left medial epicondylitis/call first elbow-discussed treatment plan including home stretches/exercises,  anti-inflammatory, and icing as needed. And out provided with exercises and additional information. Return if not improving over the next 3-4 weeks.

## 2016-07-11 NOTE — Progress Notes (Unsigned)
Subjective:   Zachary Miranda is a 66 y.o. male who presents for Medicare Annual/Subsequent preventive examination.  Review of Systems:  *** Cardiac Risk Factors include: advanced age (>19men, >77 women);dyslipidemia;hypertension;male gender;obesity (BMI >30kg/m2)     Objective:    Vitals: BP 133/65   Pulse 64   Temp 97.7 F (36.5 C) (Oral)   Resp 16   Ht 5\' 10"  (1.778 m)   Wt 230 lb (104.3 kg)   SpO2 100%   BMI 33.00 kg/m   Body mass index is 33 kg/m.  Tobacco History  Smoking Status  . Never Smoker  Smokeless Tobacco  . Never Used     Counseling given: Not Answered   Past Medical History:  Diagnosis Date  . Allergy   . Hyperlipidemia   . Hypertension   . Migraines    occular   Past Surgical History:  Procedure Laterality Date  . LASIK    . MANDIBLE SURGERY     Family History  Problem Relation Age of Onset  . Cancer Sister   . Parkinson's disease Sister   . Cancer Father     melanoma, died age 80  . Heart disease Father 75    MI  . Heart attack Father   . AAA (abdominal aortic aneurysm) Brother   . Heart disease Brother   . Heart disease Brother   . Alzheimer's disease Brother   . Thyroid disease Mother 67    died 58   History  Sexual Activity  . Sexual activity: Not on file    Outpatient Encounter Prescriptions as of 07/11/2016  Medication Sig  . aspirin 81 MG tablet Take 81 mg by mouth daily.  . cholecalciferol (VITAMIN D) 1000 UNITS tablet Take 1,000 Units by mouth daily.  Marland Kitchen co-enzyme Q-10 30 MG capsule Take 30 mg by mouth 3 (three) times daily.  . Multiple Vitamins-Minerals (MULTIVITAMIN WITH MINERALS) tablet Take 1 tablet by mouth daily.  . Omega-3 Fatty Acids (FISH OIL) 1000 MG CAPS Take by mouth.  . simvastatin (ZOCOR) 20 MG tablet Take 1 tablet (20 mg total) by mouth daily.  . valsartan-hydrochlorothiazide (DIOVAN-HCT) 320-12.5 MG tablet Take 1 tablet by mouth daily.  . AMBULATORY NON FORMULARY MEDICATION Medication Name:  Tdap IM x 1 (Patient not taking: Reported on 07/11/2016)   No facility-administered encounter medications on file as of 07/11/2016.     Activities of Daily Living In your present state of health, do you have any difficulty performing the following activities: 07/11/2016  Hearing? N  Vision? N  Difficulty concentrating or making decisions? N  Walking or climbing stairs? N  Dressing or bathing? N  Doing errands, shopping? N  Preparing Food and eating ? N  Using the Toilet? N  In the past six months, have you accidently leaked urine? N  Do you have problems with loss of bowel control? N  Managing your Medications? N  Managing your Finances? N  Housekeeping or managing your Housekeeping? N  Some recent data might be hidden    Patient Care Team: Hali Marry, MD as PCP - General   Assessment:    *** Exercise Activities and Dietary recommendations Current Exercise Habits: The patient has a physically strenous job, but has no regular exercise apart from work., Exercise limited by: None identified  Goals      Patient Stated   . Exercise 3x per week (30 min per time) (pt-stated)          He wants to  get back to working out more in the gym; about 3x/week.      Fall Risk Fall Risk  07/11/2016 07/04/2015 12/23/2014  Falls in the past year? No No No   Depression Screen PHQ 2/9 Scores 07/11/2016 07/04/2015 12/23/2014  PHQ - 2 Score 0 0 0    Cognitive Function     6CIT Screen 07/11/2016  What Year? 0 points  What month? 0 points  What time? 0 points  Count back from 20 0 points  Months in reverse 0 points  Repeat phrase 0 points  Total Score 0    Immunization History  Administered Date(s) Administered  . Influenza Split 03/23/2012  . Influenza Whole 04/12/2009, 02/25/2011  . Influenza, High Dose Seasonal PF 05/18/2015  . Influenza-Unspecified 03/21/2013  . Pneumococcal Conjugate-13 12/23/2014  . Pneumococcal Polysaccharide-23 01/10/2016  . Td 10/08/2005  . Zoster  02/21/2011   Screening Tests Health Maintenance  Topic Date Due  . TETANUS/TDAP  10/09/2015  . INFLUENZA VACCINE  01/16/2016  . COLONOSCOPY  06/28/2021  . ZOSTAVAX  Completed  . Hepatitis C Screening  Completed  . PNA vac Low Risk Adult  Completed      Plan:    During the course of the visit the patient was educated and counseled about the following appropriate screening and preventive services:   Vaccines to include Pneumoccal, Influenza, Hepatitis B, Td, Zostavax, HCV  Electrocardiogram  Cardiovascular Disease  Colorectal cancer screening  Diabetes screening  Prostate Cancer Screening  Glaucoma screening  Nutrition counseling   Smoking cessation counseling  Patient Instructions (the written plan) was given to the patient.    Nestor Lewandowsky, RN  07/11/2016

## 2016-07-12 LAB — PSA: PSA: 1.9 ng/mL (ref <=4.0)

## 2016-07-12 NOTE — Progress Notes (Signed)
All labs are normal. 

## 2016-09-23 DIAGNOSIS — M545 Low back pain: Secondary | ICD-10-CM | POA: Diagnosis not present

## 2016-09-23 DIAGNOSIS — M9903 Segmental and somatic dysfunction of lumbar region: Secondary | ICD-10-CM | POA: Diagnosis not present

## 2016-09-23 DIAGNOSIS — M9905 Segmental and somatic dysfunction of pelvic region: Secondary | ICD-10-CM | POA: Diagnosis not present

## 2016-09-23 DIAGNOSIS — M542 Cervicalgia: Secondary | ICD-10-CM | POA: Diagnosis not present

## 2016-09-23 DIAGNOSIS — M25552 Pain in left hip: Secondary | ICD-10-CM | POA: Diagnosis not present

## 2016-09-25 DIAGNOSIS — M542 Cervicalgia: Secondary | ICD-10-CM | POA: Diagnosis not present

## 2016-09-25 DIAGNOSIS — M545 Low back pain: Secondary | ICD-10-CM | POA: Diagnosis not present

## 2016-09-25 DIAGNOSIS — M9903 Segmental and somatic dysfunction of lumbar region: Secondary | ICD-10-CM | POA: Diagnosis not present

## 2016-09-25 DIAGNOSIS — M25552 Pain in left hip: Secondary | ICD-10-CM | POA: Diagnosis not present

## 2016-09-25 DIAGNOSIS — M9905 Segmental and somatic dysfunction of pelvic region: Secondary | ICD-10-CM | POA: Diagnosis not present

## 2016-09-27 DIAGNOSIS — M542 Cervicalgia: Secondary | ICD-10-CM | POA: Diagnosis not present

## 2016-09-27 DIAGNOSIS — M9905 Segmental and somatic dysfunction of pelvic region: Secondary | ICD-10-CM | POA: Diagnosis not present

## 2016-09-27 DIAGNOSIS — M9903 Segmental and somatic dysfunction of lumbar region: Secondary | ICD-10-CM | POA: Diagnosis not present

## 2016-09-27 DIAGNOSIS — M545 Low back pain: Secondary | ICD-10-CM | POA: Diagnosis not present

## 2016-10-01 DIAGNOSIS — M9903 Segmental and somatic dysfunction of lumbar region: Secondary | ICD-10-CM | POA: Diagnosis not present

## 2016-10-01 DIAGNOSIS — M9905 Segmental and somatic dysfunction of pelvic region: Secondary | ICD-10-CM | POA: Diagnosis not present

## 2016-10-01 DIAGNOSIS — M545 Low back pain: Secondary | ICD-10-CM | POA: Diagnosis not present

## 2016-10-01 DIAGNOSIS — M25552 Pain in left hip: Secondary | ICD-10-CM | POA: Diagnosis not present

## 2016-10-01 DIAGNOSIS — M542 Cervicalgia: Secondary | ICD-10-CM | POA: Diagnosis not present

## 2016-10-04 DIAGNOSIS — M9905 Segmental and somatic dysfunction of pelvic region: Secondary | ICD-10-CM | POA: Diagnosis not present

## 2016-10-04 DIAGNOSIS — M9903 Segmental and somatic dysfunction of lumbar region: Secondary | ICD-10-CM | POA: Diagnosis not present

## 2016-10-04 DIAGNOSIS — M542 Cervicalgia: Secondary | ICD-10-CM | POA: Diagnosis not present

## 2016-10-09 ENCOUNTER — Ambulatory Visit (INDEPENDENT_AMBULATORY_CARE_PROVIDER_SITE_OTHER): Payer: Medicare Other | Admitting: Family Medicine

## 2016-10-09 ENCOUNTER — Encounter: Payer: Self-pay | Admitting: Family Medicine

## 2016-10-09 VITALS — BP 124/76 | HR 65 | Wt 235.0 lb

## 2016-10-09 DIAGNOSIS — R7301 Impaired fasting glucose: Secondary | ICD-10-CM

## 2016-10-09 DIAGNOSIS — M542 Cervicalgia: Secondary | ICD-10-CM | POA: Diagnosis not present

## 2016-10-09 DIAGNOSIS — I10 Essential (primary) hypertension: Secondary | ICD-10-CM

## 2016-10-09 DIAGNOSIS — M9903 Segmental and somatic dysfunction of lumbar region: Secondary | ICD-10-CM | POA: Diagnosis not present

## 2016-10-09 DIAGNOSIS — S76302A Unspecified injury of muscle, fascia and tendon of the posterior muscle group at thigh level, left thigh, initial encounter: Secondary | ICD-10-CM

## 2016-10-09 DIAGNOSIS — M9905 Segmental and somatic dysfunction of pelvic region: Secondary | ICD-10-CM | POA: Diagnosis not present

## 2016-10-09 LAB — POCT GLYCOSYLATED HEMOGLOBIN (HGB A1C): Hemoglobin A1C: 6.1

## 2016-10-09 NOTE — Progress Notes (Signed)
Subjective:    CC: DM  HPI:  Impaired fasting glucose-no increased thirst or urination. No symptoms consistent with hypoglycemia.He quit eating [employer] this and has gotten back to the gym.  Hypertension- Pt denies chest pain, SOB, dizziness, or heart palpitations.  Taking meds as directed w/o problems.  Denies medication side effects.    Is also had a left hamstring strain. He started exercising again recently and going back to the gym. He is getting on the treadmill and using incline. He has actually been going to a chiropractor for couple weeks to try to get it better. It has been improving some is not nearly as painful. He says it's the worse when he first gets out of bed in the morning and tries to raise his leg. He's been doing some stretches that the chiropractor gave him as well. He still been able to exercise with it.   Past medical history, Surgical history, Family history not pertinant except as noted below, Social history, Allergies, and medications have been entered into the medical record, reviewed, and corrections made.   Review of Systems: No fevers, chills, night sweats, weight loss, chest pain, or shortness of breath.   Objective:    General: Well Developed, well nourished, and in no acute distress.  Neuro: Alert and oriented x3, extra-ocular muscles intact, sensation grossly intact.  HEENT: Normocephalic, atraumatic  Skin: Warm and dry, no rashes. Cardiac: Regular rate and rhythm, no murmurs rubs or gallops, no lower extremity edema.  Respiratory: Clear to auscultation bilaterally. Not using accessory muscles, speaking in full sentences.   Impression and Recommendations:   HTN - Well controlled. Continue current regimen. Follow up in  6 months.  .     IFG - Hemoglobin back down to 6.1 today so fantastic progress. Continue work on current regimen and follow-up in 6 months.  Left hamstring strain-does seem to be getting better with the stretches interpreted care. Recommend  he lower the incline on the treadmill while he's working out of gym until feels completely better. Also recommend continuing with good stretching and using heat in the evenings if needed.

## 2016-10-10 ENCOUNTER — Other Ambulatory Visit: Payer: Self-pay | Admitting: Family Medicine

## 2016-10-16 DIAGNOSIS — H5203 Hypermetropia, bilateral: Secondary | ICD-10-CM | POA: Diagnosis not present

## 2016-10-16 DIAGNOSIS — H43811 Vitreous degeneration, right eye: Secondary | ICD-10-CM | POA: Diagnosis not present

## 2016-10-16 DIAGNOSIS — H2510 Age-related nuclear cataract, unspecified eye: Secondary | ICD-10-CM | POA: Diagnosis not present

## 2016-10-16 DIAGNOSIS — H524 Presbyopia: Secondary | ICD-10-CM | POA: Diagnosis not present

## 2016-10-18 DIAGNOSIS — M9905 Segmental and somatic dysfunction of pelvic region: Secondary | ICD-10-CM | POA: Diagnosis not present

## 2016-10-18 DIAGNOSIS — M542 Cervicalgia: Secondary | ICD-10-CM | POA: Diagnosis not present

## 2016-10-18 DIAGNOSIS — M9903 Segmental and somatic dysfunction of lumbar region: Secondary | ICD-10-CM | POA: Diagnosis not present

## 2016-11-21 DIAGNOSIS — E119 Type 2 diabetes mellitus without complications: Secondary | ICD-10-CM | POA: Diagnosis not present

## 2016-11-21 DIAGNOSIS — M722 Plantar fascial fibromatosis: Secondary | ICD-10-CM | POA: Diagnosis not present

## 2016-11-21 DIAGNOSIS — M7752 Other enthesopathy of left foot: Secondary | ICD-10-CM | POA: Diagnosis not present

## 2016-11-21 DIAGNOSIS — M85472 Solitary bone cyst, left ankle and foot: Secondary | ICD-10-CM | POA: Diagnosis not present

## 2016-11-28 DIAGNOSIS — Z79899 Other long term (current) drug therapy: Secondary | ICD-10-CM | POA: Diagnosis not present

## 2016-11-28 DIAGNOSIS — E785 Hyperlipidemia, unspecified: Secondary | ICD-10-CM | POA: Diagnosis not present

## 2016-11-28 DIAGNOSIS — M65311 Trigger thumb, right thumb: Secondary | ICD-10-CM | POA: Diagnosis not present

## 2016-11-28 DIAGNOSIS — M79644 Pain in right finger(s): Secondary | ICD-10-CM | POA: Diagnosis not present

## 2016-11-28 DIAGNOSIS — Z888 Allergy status to other drugs, medicaments and biological substances status: Secondary | ICD-10-CM | POA: Diagnosis not present

## 2016-11-28 DIAGNOSIS — Z7982 Long term (current) use of aspirin: Secondary | ICD-10-CM | POA: Diagnosis not present

## 2016-11-28 DIAGNOSIS — I10 Essential (primary) hypertension: Secondary | ICD-10-CM | POA: Diagnosis not present

## 2017-01-09 DIAGNOSIS — M170 Bilateral primary osteoarthritis of knee: Secondary | ICD-10-CM | POA: Diagnosis not present

## 2017-04-07 ENCOUNTER — Other Ambulatory Visit: Payer: Self-pay | Admitting: Family Medicine

## 2017-04-09 NOTE — Progress Notes (Signed)
Subjective:    CC: DM, BP ck  HPI:  Impaired fasting glucose-no increased thirst or urination. No symptoms consistent with hypoglycemia.   Hypertension- Pt denies chest pain, SOB, dizziness, or heart palpitations.  Taking meds as directed w/o problems.  Denies medication side effects.    Hasn't been exercising regularly late.  He actually had plantar fasciitis earlier this year and that triggered some problems with his knee.  He ended up getting an injection in his foot for the plantar fasciitis.  Then he ended up getting another Synvisc injection in that left knee.  He is doing some better but has not been back to the gym yet.  He has noticed that he gets occasionally winded or short of breath if he is walking a longer distance.  He is not expensing any chest pain with it and is still able to mow his lawn without difficulty.  He does have a family history of coronary artery disease and 2 of his brothers that they were both heavy smokers.    Past medical history, Surgical history, Family history not pertinant except as noted below, Social history, Allergies, and medications have been entered into the medical record, reviewed, and corrections made.   Review of Systems: No fevers, chills, night sweats, weight loss, chest pain, or shortness of breath.   Objective:    General: Well Developed, well nourished, and in no acute distress.  Neuro: Alert and oriented x3, extra-ocular muscles intact, sensation grossly intact.  HEENT: Normocephalic, atraumatic  Skin: Warm and dry, no rashes. Cardiac: Regular rate and rhythm, no murmurs rubs or gallops, no lower extremity edema.  Respiratory: Clear to auscultation bilaterally. Not using accessory muscles, speaking in full sentences.   Impression and Recommendations:    HTN - Well controlled. Continue current regimen. Follow up in  6 months.  We will go ahead and order CMP and lipid.  He is a little bit early but he is now on every six-month visits  and wants to go ahead and get it done today so that he does not have to come back between now and April.  Impaired fasting glucose-well-controlled.  Continue current regimen.  Continue to work on healthy diet and regular exercise.  Did encourage him to get back into the gym.  Shortness of breath-most likely related to deconditioning but with family history of coronary artery disease we discussed the possibility of getting him scheduled for stress test.  He had one done around 2002 which was essentially normal at that time.  He has not been experiencing any chest pain.  He says he wants to hold off and try to get back into the gym and workout regularly.  Want him to call me at any point if he feels like his symptoms are progressing or becoming more frequent or if it any point he expenses chest pain.  Due for screening PSA.

## 2017-04-10 ENCOUNTER — Ambulatory Visit (INDEPENDENT_AMBULATORY_CARE_PROVIDER_SITE_OTHER): Payer: Medicare Other | Admitting: Family Medicine

## 2017-04-10 ENCOUNTER — Encounter: Payer: Self-pay | Admitting: Family Medicine

## 2017-04-10 VITALS — BP 119/79 | HR 65 | Ht 70.0 in | Wt 237.0 lb

## 2017-04-10 DIAGNOSIS — Z125 Encounter for screening for malignant neoplasm of prostate: Secondary | ICD-10-CM

## 2017-04-10 DIAGNOSIS — I10 Essential (primary) hypertension: Secondary | ICD-10-CM

## 2017-04-10 DIAGNOSIS — R0602 Shortness of breath: Secondary | ICD-10-CM

## 2017-04-10 DIAGNOSIS — R7301 Impaired fasting glucose: Secondary | ICD-10-CM | POA: Diagnosis not present

## 2017-04-10 DIAGNOSIS — E785 Hyperlipidemia, unspecified: Secondary | ICD-10-CM | POA: Diagnosis not present

## 2017-04-10 LAB — COMPLETE METABOLIC PANEL WITH GFR
AG Ratio: 1.8 (calc) (ref 1.0–2.5)
ALBUMIN MSPROF: 4.4 g/dL (ref 3.6–5.1)
ALKALINE PHOSPHATASE (APISO): 58 U/L (ref 40–115)
ALT: 18 U/L (ref 9–46)
AST: 20 U/L (ref 10–35)
BUN: 15 mg/dL (ref 7–25)
CALCIUM: 9.2 mg/dL (ref 8.6–10.3)
CO2: 29 mmol/L (ref 20–32)
CREATININE: 1 mg/dL (ref 0.70–1.25)
Chloride: 103 mmol/L (ref 98–110)
GFR, EST NON AFRICAN AMERICAN: 78 mL/min/{1.73_m2} (ref >=60)
GFR, Est African American: 90 mL/min/{1.73_m2} (ref >=60)
GLUCOSE: 110 mg/dL — AB (ref 65–99)
Globulin: 2.4 g/dL (calc) (ref 1.9–3.7)
Potassium: 4.1 mmol/L (ref 3.5–5.3)
Sodium: 139 mmol/L (ref 135–146)
TOTAL PROTEIN: 6.8 g/dL (ref 6.1–8.1)
Total Bilirubin: 0.9 mg/dL (ref 0.2–1.2)

## 2017-04-10 LAB — PSA: PSA: 1.3 ng/mL (ref <=4.0)

## 2017-04-10 LAB — LIPID PANEL W/REFLEX DIRECT LDL
CHOL/HDL RATIO: 3 (calc) (ref <5.0)
CHOLESTEROL: 184 mg/dL (ref <200)
HDL: 61 mg/dL (ref >40)
LDL CHOLESTEROL (CALC): 99 mg/dL
Non-HDL Cholesterol (Calc): 123 mg/dL (calc) (ref <130)
Triglycerides: 143 mg/dL (ref <150)

## 2017-04-10 LAB — POCT GLYCOSYLATED HEMOGLOBIN (HGB A1C): Hemoglobin A1C: 6.1

## 2017-04-10 MED ORDER — VALSARTAN-HYDROCHLOROTHIAZIDE 320-12.5 MG PO TABS: 1.0000 | ORAL_TABLET | Freq: Every day | ORAL | 3 refills | Status: DC

## 2017-04-10 MED ORDER — SIMVASTATIN 20 MG PO TABS: 20.0000 mg | ORAL_TABLET | Freq: Every day | ORAL | 3 refills | Status: DC

## 2017-04-11 ENCOUNTER — Telehealth: Payer: Self-pay | Admitting: Family Medicine

## 2017-04-11 DIAGNOSIS — E785 Hyperlipidemia, unspecified: Secondary | ICD-10-CM

## 2017-04-11 MED ORDER — SIMVASTATIN 40 MG PO TABS: 40.0000 mg | ORAL_TABLET | Freq: Every day | ORAL | 3 refills | Status: DC

## 2017-04-11 NOTE — Telephone Encounter (Signed)
Patient called back about lab results and I adv him of lab results. Pt said yes to increase for Simvastatin. Thanks

## 2017-04-11 NOTE — Telephone Encounter (Signed)
OK, new scripts sent to pharmacy for 40mg .

## 2017-04-14 NOTE — Telephone Encounter (Signed)
Pt advised.

## 2017-08-05 ENCOUNTER — Ambulatory Visit (INDEPENDENT_AMBULATORY_CARE_PROVIDER_SITE_OTHER): Payer: Medicare Other | Admitting: Sports Medicine

## 2017-08-05 ENCOUNTER — Encounter: Payer: Self-pay | Admitting: Sports Medicine

## 2017-08-05 DIAGNOSIS — J209 Acute bronchitis, unspecified: Secondary | ICD-10-CM | POA: Diagnosis not present

## 2017-08-05 DIAGNOSIS — J40 Bronchitis, not specified as acute or chronic: Secondary | ICD-10-CM | POA: Insufficient documentation

## 2017-08-05 MED ORDER — FLUTICASONE PROPIONATE 50 MCG/ACT NA SUSP: NASAL | 3 refills | Status: AC

## 2017-08-05 MED ORDER — AZITHROMYCIN 250 MG PO TABS: ORAL_TABLET | ORAL | 0 refills | Status: DC

## 2017-08-05 NOTE — Assessment & Plan Note (Signed)
With right maxillary sinusitis. Azithromycin, Flonase. Call me if no better in 5 days. We will switch to doxycycline at that point.

## 2017-08-05 NOTE — Progress Notes (Signed)
Subjective:    CC: Feeling sick  HPI: This is a pleasant 67 year old male, for the past 3 weeks he has had a cough, worsening right maxillary sinus pressure.  Stuffy nose.  Symptoms are moderate, persistent.  Cough productive of mucus.  No constitutional symptoms, no GI symptoms, no skin rash, no shortness of breath.  I reviewed the past medical history, family history, social history, surgical history, and allergies today and no changes were needed.  Please see the problem list section below in epic for further details.  Past Medical History: Past Medical History:  Diagnosis Date  . Allergy   . Hyperlipidemia   . Hypertension   . Migraines    occular   Past Surgical History: Past Surgical History:  Procedure Laterality Date  . LASIK    . MANDIBLE SURGERY     Social History: Social History   Socioeconomic History  . Marital status: Married    Spouse name: None  . Number of children: None  . Years of education: None  . Highest education level: None  Social Needs  . Financial resource strain: None  . Food insecurity - worry: None  . Food insecurity - inability: None  . Transportation needs - medical: None  . Transportation needs - non-medical: None  Occupational History  . None  Tobacco Use  . Smoking status: Never Smoker  . Smokeless tobacco: Never Used  Substance and Sexual Activity  . Alcohol use: Yes    Alcohol/week: 6.0 oz    Types: 6 Standard drinks or equivalent, 4 Cans of beer per week    Comment: States he never drinks more than 3 beers at a time.  . Drug use: No  . Sexual activity: None  Other Topics Concern  . None  Social History Narrative  . None   Family History: Family History  Problem Relation Age of Onset  . Cancer Sister   . Parkinson's disease Sister   . Cancer Father        melanoma, died age 72  . Heart disease Father 12       MI  . Heart attack Father   . AAA (abdominal aortic aneurysm) Brother   . Heart disease Brother   .  Heart disease Brother   . Alzheimer's disease Brother   . Thyroid disease Mother 53       died 88   Allergies: Allergies  Allergen Reactions  . Atorvastatin     REACTION: myalgias  . Prochlorperazine     Other reaction(s): Other (See Comments) Had reaction ~ > 40 yrs ago, muscle contractions  . Rosuvastatin     REACTION: cause myalgias   Medications: See med rec.  Review of Systems: No fevers, chills, night sweats, weight loss, chest pain, or shortness of breath.   Objective:    General: Well Developed, well nourished, and in no acute distress.  Neuro: Alert and oriented x3, extra-ocular muscles intact, sensation grossly intact.  HEENT: Normocephalic, atraumatic, pupils equal round reactive to light, neck supple, no masses, no lymphadenopathy, thyroid nonpalpable.  Oropharynx, ear canals unremarkable, boggy and erythematous turbinates in the nasopharynx.  Mild tenderness over the right maxillary sinus Skin: Warm and dry, no rashes. Cardiac: Regular rate and rhythm, no murmurs rubs or gallops, no lower extremity edema.  Respiratory: Clear to auscultation bilaterally. Not using accessory muscles, speaking in full sentences.  Impression and Recommendations:    Acute bronchitis With right maxillary sinusitis. Azithromycin, Flonase. Call me if no better in 5  days. We will switch to doxycycline at that point. ___________________________________________ Gwen Her. Dianah Field, M.D., ABFM., CAQSM. Primary Care and [place] Instructor of [place] [facility]

## 2017-08-13 ENCOUNTER — Telehealth: Payer: Self-pay

## 2017-08-13 NOTE — Telephone Encounter (Signed)
Pt left VM stating that you informed him you would send him another antibiotic if he wasn't feeling better after medications. Please advise.

## 2017-08-14 MED ORDER — DOXYCYCLINE HYCLATE 100 MG PO TABS: 100.0000 mg | ORAL_TABLET | Freq: Two times a day (BID) | ORAL | 0 refills | Status: AC

## 2017-08-14 NOTE — Telephone Encounter (Signed)
Left VM

## 2017-08-14 NOTE — Telephone Encounter (Signed)
Switching to Doxycycline.

## 2017-10-08 NOTE — Progress Notes (Signed)
Subjective:    CC: 6 mo f/u.    HPI:  Hypertension- Pt denies chest pain, SOB, dizziness, or heart palpitations.  Taking meds as directed w/o problems.  Denies medication side effects.    Impaired fasting glucose-no increased thirst or urination. No symptoms consistent with hypoglycemia.  Hyperlipdemia - doing well on inc dose of simvastatin.    Past medical history, Surgical history, Family history not pertinant except as noted below, Social history, Allergies, and medications have been entered into the medical record, reviewed, and corrections made.   Review of Systems: No fevers, chills, night sweats, weight loss, chest pain, or shortness of breath.   Objective:    General: Well Developed, well nourished, and in no acute distress.  Neuro: Alert and oriented x3, extra-ocular muscles intact, sensation grossly intact.  HEENT: Normocephalic, atraumatic  Skin: Warm and dry, no rashes. Cardiac: Regular rate and rhythm, no murmurs rubs or gallops, no lower extremity edema.  Respiratory: Clear to auscultation bilaterally. Not using accessory muscles, speaking in full sentences.   Impression and Recommendations:    IFG - A1C is up to 6.4.  Borderline for diabetes.   HTN  - Well controlled. Continue current regimen. Follow up in  6 months.    Hyeprlipidemia - doing well on increased dose of simvastatin. Recheck lipids.

## 2017-10-09 ENCOUNTER — Encounter: Payer: Self-pay | Admitting: Family Medicine

## 2017-10-09 ENCOUNTER — Ambulatory Visit (INDEPENDENT_AMBULATORY_CARE_PROVIDER_SITE_OTHER): Payer: Medicare Other | Admitting: Family Medicine

## 2017-10-09 VITALS — BP 132/69 | HR 65 | Ht 70.0 in | Wt 242.0 lb

## 2017-10-09 DIAGNOSIS — I10 Essential (primary) hypertension: Secondary | ICD-10-CM | POA: Diagnosis not present

## 2017-10-09 DIAGNOSIS — E785 Hyperlipidemia, unspecified: Secondary | ICD-10-CM | POA: Diagnosis not present

## 2017-10-09 DIAGNOSIS — R7301 Impaired fasting glucose: Secondary | ICD-10-CM

## 2017-10-09 LAB — COMPLETE METABOLIC PANEL WITH GFR
AG RATIO: 1.8 (calc) (ref 1.0–2.5)
ALKALINE PHOSPHATASE (APISO): 62 U/L (ref 40–115)
ALT: 17 U/L (ref 9–46)
AST: 20 U/L (ref 10–35)
Albumin: 4.2 g/dL (ref 3.6–5.1)
BUN: 16 mg/dL (ref 7–25)
CO2: 28 mmol/L (ref 20–32)
Calcium: 9.3 mg/dL (ref 8.6–10.3)
Chloride: 104 mmol/L (ref 98–110)
Creat: 1 mg/dL (ref 0.70–1.25)
GFR, Est African American: 90 mL/min/{1.73_m2} (ref >=60)
GFR, Est Non African American: 78 mL/min/{1.73_m2} (ref >=60)
GLOBULIN: 2.3 g/dL (ref 1.9–3.7)
Glucose, Bld: 115 mg/dL — ABNORMAL HIGH (ref 65–99)
POTASSIUM: 4.4 mmol/L (ref 3.5–5.3)
SODIUM: 139 mmol/L (ref 135–146)
Total Bilirubin: 0.7 mg/dL (ref 0.2–1.2)
Total Protein: 6.5 g/dL (ref 6.1–8.1)

## 2017-10-09 LAB — LIPID PANEL
CHOL/HDL RATIO: 3.2 (calc) (ref <5.0)
Cholesterol: 164 mg/dL (ref <200)
HDL: 52 mg/dL (ref >40)
LDL CHOLESTEROL (CALC): 89 mg/dL
Non-HDL Cholesterol (Calc): 112 mg/dL (calc) (ref <130)
Triglycerides: 130 mg/dL (ref <150)

## 2017-10-09 LAB — POCT GLYCOSYLATED HEMOGLOBIN (HGB A1C): HEMOGLOBIN A1C: 6.4

## 2017-10-09 MED ORDER — SIMVASTATIN 40 MG PO TABS: 40.0000 mg | ORAL_TABLET | Freq: Every day | ORAL | 3 refills | Status: DC

## 2017-10-09 NOTE — Progress Notes (Signed)
All labs are normal. 

## 2017-10-23 ENCOUNTER — Ambulatory Visit: Payer: Medicare Other

## 2017-12-03 DIAGNOSIS — H524 Presbyopia: Secondary | ICD-10-CM | POA: Diagnosis not present

## 2017-12-03 DIAGNOSIS — H2510 Age-related nuclear cataract, unspecified eye: Secondary | ICD-10-CM | POA: Diagnosis not present

## 2017-12-08 DIAGNOSIS — M542 Cervicalgia: Secondary | ICD-10-CM | POA: Diagnosis not present

## 2017-12-08 DIAGNOSIS — M9903 Segmental and somatic dysfunction of lumbar region: Secondary | ICD-10-CM | POA: Diagnosis not present

## 2017-12-08 DIAGNOSIS — M9905 Segmental and somatic dysfunction of pelvic region: Secondary | ICD-10-CM | POA: Diagnosis not present

## 2017-12-08 DIAGNOSIS — M25551 Pain in right hip: Secondary | ICD-10-CM | POA: Diagnosis not present

## 2017-12-08 DIAGNOSIS — M545 Low back pain: Secondary | ICD-10-CM | POA: Diagnosis not present

## 2017-12-10 DIAGNOSIS — M9905 Segmental and somatic dysfunction of pelvic region: Secondary | ICD-10-CM | POA: Diagnosis not present

## 2017-12-10 DIAGNOSIS — M9903 Segmental and somatic dysfunction of lumbar region: Secondary | ICD-10-CM | POA: Diagnosis not present

## 2017-12-10 DIAGNOSIS — M542 Cervicalgia: Secondary | ICD-10-CM | POA: Diagnosis not present

## 2017-12-15 DIAGNOSIS — M542 Cervicalgia: Secondary | ICD-10-CM | POA: Diagnosis not present

## 2017-12-15 DIAGNOSIS — M545 Low back pain: Secondary | ICD-10-CM | POA: Diagnosis not present

## 2017-12-15 DIAGNOSIS — M9903 Segmental and somatic dysfunction of lumbar region: Secondary | ICD-10-CM | POA: Diagnosis not present

## 2017-12-15 DIAGNOSIS — M25551 Pain in right hip: Secondary | ICD-10-CM | POA: Diagnosis not present

## 2017-12-15 DIAGNOSIS — M9905 Segmental and somatic dysfunction of pelvic region: Secondary | ICD-10-CM | POA: Diagnosis not present

## 2017-12-17 DIAGNOSIS — M9905 Segmental and somatic dysfunction of pelvic region: Secondary | ICD-10-CM | POA: Diagnosis not present

## 2017-12-17 DIAGNOSIS — M25551 Pain in right hip: Secondary | ICD-10-CM | POA: Diagnosis not present

## 2017-12-17 DIAGNOSIS — M545 Low back pain: Secondary | ICD-10-CM | POA: Diagnosis not present

## 2017-12-17 DIAGNOSIS — M9903 Segmental and somatic dysfunction of lumbar region: Secondary | ICD-10-CM | POA: Diagnosis not present

## 2017-12-17 DIAGNOSIS — M542 Cervicalgia: Secondary | ICD-10-CM | POA: Diagnosis not present

## 2017-12-22 DIAGNOSIS — M542 Cervicalgia: Secondary | ICD-10-CM | POA: Diagnosis not present

## 2017-12-22 DIAGNOSIS — M545 Low back pain: Secondary | ICD-10-CM | POA: Diagnosis not present

## 2017-12-22 DIAGNOSIS — M9903 Segmental and somatic dysfunction of lumbar region: Secondary | ICD-10-CM | POA: Diagnosis not present

## 2017-12-22 DIAGNOSIS — M9905 Segmental and somatic dysfunction of pelvic region: Secondary | ICD-10-CM | POA: Diagnosis not present

## 2018-01-22 ENCOUNTER — Ambulatory Visit (INDEPENDENT_AMBULATORY_CARE_PROVIDER_SITE_OTHER): Payer: Medicare Other | Admitting: Family Medicine

## 2018-01-22 ENCOUNTER — Encounter: Payer: Self-pay | Admitting: Family Medicine

## 2018-01-22 VITALS — BP 120/71 | HR 63 | Ht 70.0 in | Wt 243.0 lb

## 2018-01-22 DIAGNOSIS — R7301 Impaired fasting glucose: Secondary | ICD-10-CM

## 2018-01-22 DIAGNOSIS — I10 Essential (primary) hypertension: Secondary | ICD-10-CM

## 2018-01-22 LAB — POCT GLYCOSYLATED HEMOGLOBIN (HGB A1C): HEMOGLOBIN A1C: 6 % — AB (ref 4.0–5.6)

## 2018-01-22 NOTE — Progress Notes (Signed)
Subjective:    CC: IFG  HPI:  Impaired fasting glucose-no increased thirst or urination. No symptoms consistent with hypoglycemia.  Had them come back early this time because his A1c was 6.4 last time.  He actually has made some changes.  He is cut back on some of the sugary sweets which is really what he struggles with.  He already was doing a good job and eating a fairly low-carb diet but has cut back on the bread a little bit more.  He works out in the heat all day so has not had a definite exercise regimen but says he is been walking further and longer when he walks his dogs.  He also wanted to give me an update on his brothers.  One has been diagnosed with pretty advanced Alzheimer's disease and he is 54.  He was actually able to get up to [facility] to visit him over the summer.  His other brother was recently diagnosed with COPD and sleep apnea.  He denies loud and frequent snoring.  He says he does snore occasionally but usually is somewhat positional.  He denies any morning headaches or excess daytime fatigue.   Hypertension- Pt denies chest pain, SOB, dizziness, or heart palpitations.  Taking meds as directed w/o problems.  Denies medication side effects.      Past medical history, Surgical history, Family history not pertinant except as noted below, Social history, Allergies, and medications have been entered into the medical record, reviewed, and corrections made.   Review of Systems: No fevers, chills, night sweats, weight loss, chest pain, or shortness of breath.   Objective:    General: Well Developed, well nourished, and in no acute distress.  Neuro: Alert and oriented x3, extra-ocular muscles intact, sensation grossly intact.  HEENT: Normocephalic, atraumatic  Skin: Warm and dry, no rashes. Cardiac: Regular rate and rhythm, no murmurs rubs or gallops, no lower extremity edema.  Respiratory: Clear to auscultation bilaterally. Not using accessory muscles, speaking in full  sentences.   Impression and Recommendations:    IFG -  Improved.  Hemoglobin A1c down to 6.0 which is fantastic.  Gave him encouragement that the changes that he did make her showing up in his blood work and he is doing great.  I will see him back in 4 months.  If he is doing well at that point then plan to go back to every 14-month follow-up.  Currently his labs are up-to-date.   HTN - Well controlled. Continue current regimen. Follow up in  4 months.

## 2018-03-04 DIAGNOSIS — M65311 Trigger thumb, right thumb: Secondary | ICD-10-CM | POA: Diagnosis not present

## 2018-05-25 ENCOUNTER — Ambulatory Visit (INDEPENDENT_AMBULATORY_CARE_PROVIDER_SITE_OTHER): Payer: Medicare Other | Admitting: Family Medicine

## 2018-05-25 ENCOUNTER — Encounter: Payer: Self-pay | Admitting: Family Medicine

## 2018-05-25 VITALS — BP 127/67 | HR 71 | Ht 68.5 in | Wt 235.0 lb

## 2018-05-25 DIAGNOSIS — R7301 Impaired fasting glucose: Secondary | ICD-10-CM

## 2018-05-25 DIAGNOSIS — I10 Essential (primary) hypertension: Secondary | ICD-10-CM | POA: Diagnosis not present

## 2018-05-25 DIAGNOSIS — J069 Acute upper respiratory infection, unspecified: Secondary | ICD-10-CM | POA: Diagnosis not present

## 2018-05-25 LAB — POCT GLYCOSYLATED HEMOGLOBIN (HGB A1C): HEMOGLOBIN A1C: 6 % — AB (ref 4.0–5.6)

## 2018-05-25 LAB — BASIC METABOLIC PANEL WITH GFR
BUN: 19 mg/dL (ref 7–25)
CO2: 25 mmol/L (ref 20–32)
Calcium: 9.1 mg/dL (ref 8.6–10.3)
Chloride: 108 mmol/L (ref 98–110)
Creat: 0.98 mg/dL (ref 0.70–1.25)
GFR, Est African American: 91 mL/min/{1.73_m2} (ref >=60)
GFR, Est Non African American: 79 mL/min/{1.73_m2} (ref >=60)
GLUCOSE: 110 mg/dL — AB (ref 65–99)
Potassium: 4.3 mmol/L (ref 3.5–5.3)
Sodium: 141 mmol/L (ref 135–146)

## 2018-05-25 NOTE — Patient Instructions (Signed)
Upper Respiratory Infection, Adult Most upper respiratory infections (URIs) are caused by a virus. A URI affects the nose, throat, and upper air passages. The most common type of URI is often called "the common cold." Follow these instructions at home:  Take medicines only as told by your doctor.  Gargle warm saltwater or take cough drops to comfort your throat as told by your doctor.  Use a warm mist humidifier or inhale steam from a shower to increase air moisture. This may make it easier to breathe.  Drink enough fluid to keep your pee (urine) clear or pale yellow.  Eat soups and other clear broths.  Have a healthy diet.  Rest as needed.  Go back to work when your fever is gone or your doctor says it is okay. ? You may need to stay home longer to avoid giving your URI to others. ? You can also wear a face mask and wash your hands often to prevent spread of the virus.  Use your inhaler more if you have asthma.  Do not use any tobacco products, including cigarettes, chewing tobacco, or electronic cigarettes. If you need help quitting, ask your doctor. Contact a doctor if:  You are getting worse, not better.  Your symptoms are not helped by medicine.  You have chills.  You are getting more short of breath.  You have brown or red mucus.  You have yellow or brown discharge from your nose.  You have pain in your face, especially when you bend forward.  You have a fever.  You have puffy (swollen) neck glands.  You have pain while swallowing.  You have white areas in the back of your throat. Get help right away if:  You have very bad or constant: ? Headache. ? Ear pain. ? Pain in your forehead, behind your eyes, and over your cheekbones (sinus pain). ? Chest pain.  You have long-lasting (chronic) lung disease and any of the following: ? Wheezing. ? Long-lasting cough. ? Coughing up blood. ? A change in your usual mucus.  You have a stiff neck.  You have  changes in your: ? Vision. ? Hearing. ? Thinking. ? Mood. This information is not intended to replace advice given to you by your health care provider. Make sure you discuss any questions you have with your health care provider. Document Released: 11/20/2007 Document Revised: 02/04/2016 Document Reviewed: 09/08/2013 Elsevier Interactive Patient Education  2018 Elsevier Inc.  

## 2018-05-25 NOTE — Progress Notes (Addendum)
Subjective:    CC:   HPI:  Hypertension- Pt denies chest pain, SOB, dizziness, or heart palpitations.  Taking meds as directed w/o problems.  Denies medication side effects.    Impaired fasting glucose-no increased thirst or urination. No symptoms consistent with hypoglycemia.  Notes he has not been exercising regularly.   He is also here today for URI x 3 days.  He says start with sore throat and then developed into nasal congestion and cough.  He has had some clear mucus mostly.  He did run a low-grade temperature around 99 over the weekend. No worsening or alleviatingsxs.  Has had some watery eyes with it.   Past medical history, Surgical history, Family history not pertinant except as noted below, Social history, Allergies, and medications have been entered into the medical record, reviewed, and corrections made.   Review of Systems: No fevers, chills, night sweats, weight loss, chest pain, or shortness of breath.   Objective:    General: Well Developed, well nourished, and in no acute distress.  Neuro: Alert and oriented x3, extra-ocular muscles intact, sensation grossly intact.  HEENT: Normocephalic, atraumatic. Op is clear, TM and canals are clear bilaterally.  No significant cervical lymphadenopathy. Skin: Warm and dry, no rashes. Cardiac: Regular rate and rhythm, no murmurs rubs or gallops, no lower extremity edema.  Respiratory: Clear to auscultation bilaterally. Not using accessory muscles, speaking in full sentences.   Impression and Recommendations:    HTN -  Well controlled. Continue current regimen. Follow up in  6 months.   IFG - Well controlled. Continue current regimen. Follow up in  4 months.   URI- likely viral.  If not improving later this week or if skin suddenly getting worse.OK for symptomatic care.

## 2018-06-03 ENCOUNTER — Telehealth: Payer: Self-pay

## 2018-06-03 MED ORDER — AZITHROMYCIN 250 MG PO TABS: ORAL_TABLET | ORAL | 0 refills | Status: AC

## 2018-06-03 NOTE — Telephone Encounter (Signed)
rx for zpack sent to [facility].

## 2018-06-03 NOTE — Telephone Encounter (Signed)
Zachary Miranda states he is still sick with a productive cough with green sputum and congestion. Denies fever, chills or sweats. He states he was advised to call back if symptoms persist .

## 2018-06-03 NOTE — Telephone Encounter (Signed)
Left VM with update.  

## 2018-06-22 ENCOUNTER — Other Ambulatory Visit: Payer: Self-pay | Admitting: Family Medicine

## 2018-06-22 DIAGNOSIS — I10 Essential (primary) hypertension: Secondary | ICD-10-CM

## 2018-08-17 ENCOUNTER — Other Ambulatory Visit: Payer: Self-pay | Admitting: Family Medicine

## 2018-08-17 DIAGNOSIS — E785 Hyperlipidemia, unspecified: Secondary | ICD-10-CM

## 2018-10-27 DIAGNOSIS — H43811 Vitreous degeneration, right eye: Secondary | ICD-10-CM | POA: Diagnosis not present

## 2018-10-27 DIAGNOSIS — H2510 Age-related nuclear cataract, unspecified eye: Secondary | ICD-10-CM | POA: Diagnosis not present

## 2018-10-27 DIAGNOSIS — H524 Presbyopia: Secondary | ICD-10-CM | POA: Diagnosis not present

## 2018-11-23 ENCOUNTER — Other Ambulatory Visit: Payer: Self-pay | Admitting: *Deleted

## 2018-11-23 DIAGNOSIS — E785 Hyperlipidemia, unspecified: Secondary | ICD-10-CM

## 2018-11-23 MED ORDER — SIMVASTATIN 40 MG PO TABS: 40.0000 mg | ORAL_TABLET | Freq: Every day | ORAL | 3 refills | Status: DC

## 2018-11-25 ENCOUNTER — Ambulatory Visit (INDEPENDENT_AMBULATORY_CARE_PROVIDER_SITE_OTHER): Payer: Medicare Other | Admitting: Family Medicine

## 2018-11-25 ENCOUNTER — Encounter: Payer: Self-pay | Admitting: Family Medicine

## 2018-11-25 VITALS — BP 129/86 | HR 79 | Ht 68.5 in | Wt 234.0 lb

## 2018-11-25 DIAGNOSIS — I10 Essential (primary) hypertension: Secondary | ICD-10-CM | POA: Diagnosis not present

## 2018-11-25 DIAGNOSIS — Z125 Encounter for screening for malignant neoplasm of prostate: Secondary | ICD-10-CM

## 2018-11-25 DIAGNOSIS — R7301 Impaired fasting glucose: Secondary | ICD-10-CM

## 2018-11-25 DIAGNOSIS — E785 Hyperlipidemia, unspecified: Secondary | ICD-10-CM | POA: Diagnosis not present

## 2018-11-25 NOTE — Progress Notes (Signed)
Virtual Visit via Video Note  I connected with Zachary Miranda on 11/25/18 at  8:10 AM EDT by a video enabled telemedicine application and verified that I am speaking with the correct person using two identifiers.   I discussed the limitations of evaluation and management by telemedicine and the availability of in person appointments. The patient expressed understanding and agreed to proceed.  Pt was at home and I was in my office for the virtual visit.     Subjective:    CC: 6 mo f/u   HPI:  He did let me know that his brother with Alzheimer's who had been on hospice for almost a year finally passed away in September 26, 2022.  He said it was just sad because they were unable to get back up to visit him in with COVID there are a lot of people who just feel comfortable attending the funeral.  But he is just glad he is no longer suffering.  Hypertension- Pt denies chest pain, SOB, dizziness, or heart palpitations.  Taking meds as directed w/o problems.  Denies medication side effects.    Impaired fasting glucose-no increased thirst or urination. No symptoms consistent with hypoglycemia. BS 130 today.   Hyperlipidemia - tolerating stating well with no myalgias or significant side effects.  Lab Results  Component Value Date   CHOL 164 10/09/2017   CHOL 184 04/10/2017   CHOL 170 07/11/2016   Lab Results  Component Value Date   HDL 52 10/09/2017   HDL 61 04/10/2017   HDL 57 07/11/2016   Lab Results  Component Value Date   LDLCALC 89 10/09/2017   LDLCALC 99 04/10/2017   LDLCALC 94 07/11/2016   Lab Results  Component Value Date   TRIG 130 10/09/2017   TRIG 143 04/10/2017   TRIG 93 07/11/2016   Lab Results  Component Value Date   CHOLHDL 3.2 10/09/2017   CHOLHDL 3.0 04/10/2017   CHOLHDL 3.0 07/11/2016   No results found for: LDLDIRECT     Past medical history, Surgical history, Family history not pertinant except as noted below, Social history, Allergies, and medications  have been entered into the medical record, reviewed, and corrections made.   Review of Systems: No fevers, chills, night sweats, weight loss, chest pain, or shortness of breath.   Objective:    General: Speaking clearly in complete sentences without any shortness of breath.  Alert and oriented x3.  Normal judgment. No apparent acute distress. Well groomed.     Impression and Recommendations:    HTN - Well controlled. Continue current regimen. Follow up in  6 months. Some exercise but not consistent.   IFG - Well controlled. Continue current regimen. Follow up in  6 month.   Hyperlipidemia- continue simvastatin 40 mg.  Due for repeat lipid panel.   I discussed the assessment and treatment plan with the patient. The patient was provided an opportunity to ask questions and all were answered. The patient agreed with the plan and demonstrated an understanding of the instructions.   The patient was advised to call back or seek an in-person evaluation if the symptoms worsen or if the condition fails to improve as anticipated.   Beatrice Lecher, MD

## 2018-11-25 NOTE — Progress Notes (Signed)
BS:130.Zachary Miranda, [place]

## 2018-11-26 LAB — COMPLETE METABOLIC PANEL WITH GFR
AG Ratio: 2 (calc) (ref 1.0–2.5)
ALT: 20 U/L (ref 9–46)
AST: 23 U/L (ref 10–35)
Albumin: 4.3 g/dL (ref 3.6–5.1)
Alkaline phosphatase (APISO): 61 U/L (ref 35–144)
BUN: 18 mg/dL (ref 7–25)
CO2: 27 mmol/L (ref 20–32)
Calcium: 9.2 mg/dL (ref 8.6–10.3)
Chloride: 105 mmol/L (ref 98–110)
Creat: 1.02 mg/dL (ref 0.70–1.25)
GFR, Est African American: 87 mL/min/{1.73_m2} (ref >=60)
GFR, Est Non African American: 75 mL/min/{1.73_m2} (ref >=60)
Globulin: 2.1 g/dL (calc) (ref 1.9–3.7)
Glucose, Bld: 115 mg/dL — ABNORMAL HIGH (ref 65–99)
Potassium: 4.3 mmol/L (ref 3.5–5.3)
Sodium: 141 mmol/L (ref 135–146)
Total Bilirubin: 1 mg/dL (ref 0.2–1.2)
Total Protein: 6.4 g/dL (ref 6.1–8.1)

## 2018-11-26 LAB — LIPID PANEL
Cholesterol: 153 mg/dL (ref <200)
HDL: 55 mg/dL (ref >=40)
LDL Cholesterol (Calc): 80 mg/dL (calc)
Non-HDL Cholesterol (Calc): 98 mg/dL (calc) (ref <130)
Total CHOL/HDL Ratio: 2.8 (calc) (ref <5.0)
Triglycerides: 92 mg/dL (ref <150)

## 2018-11-26 LAB — HEMOGLOBIN A1C
Hgb A1c MFr Bld: 6.1 % of total Hgb — ABNORMAL HIGH (ref <5.7)
Mean Plasma Glucose: 128 (calc)
eAG (mmol/L): 7.1 (calc)

## 2018-11-26 LAB — PSA: PSA: 1.8 ng/mL (ref <=4.0)

## 2018-12-22 ENCOUNTER — Other Ambulatory Visit: Payer: Self-pay

## 2018-12-22 NOTE — Patient Outreach (Signed)
[facility]) Care Management  12/22/2018  Zachary Miranda [facility] September 23, 1950 [id number]   Medication Adherence call to [name] HIPPA Compliant Voice message left with a call back number. Patient is showing past due on Simvastatin 40 mg under [place].   [place] Management Direct Dial [phone]  Fax [phone] [email or web address]

## 2019-05-09 ENCOUNTER — Other Ambulatory Visit: Payer: Self-pay | Admitting: Family Medicine

## 2019-05-09 DIAGNOSIS — I10 Essential (primary) hypertension: Secondary | ICD-10-CM

## 2019-05-27 ENCOUNTER — Encounter: Payer: Self-pay | Admitting: Family Medicine

## 2019-05-27 ENCOUNTER — Other Ambulatory Visit: Payer: Self-pay

## 2019-05-27 ENCOUNTER — Ambulatory Visit (INDEPENDENT_AMBULATORY_CARE_PROVIDER_SITE_OTHER): Payer: Medicare Other | Admitting: Family Medicine

## 2019-05-27 VITALS — BP 111/71 | HR 69 | Ht 69.0 in | Wt 237.0 lb

## 2019-05-27 DIAGNOSIS — R7301 Impaired fasting glucose: Secondary | ICD-10-CM | POA: Diagnosis not present

## 2019-05-27 DIAGNOSIS — I10 Essential (primary) hypertension: Secondary | ICD-10-CM

## 2019-05-27 LAB — POCT GLYCOSYLATED HEMOGLOBIN (HGB A1C): Hemoglobin A1C: 6 % — AB (ref 4.0–5.6)

## 2019-05-27 NOTE — Progress Notes (Signed)
Established Patient Office Visit  Subjective:  Patient ID: Zachary Miranda, male    DOB: 05-23-1951  Age: 68 y.o. MRN: [id number]  CC:  Chief Complaint  Patient presents with  . Hypertension  . ifg    HPI Zachary Miranda presents for   Hypertension- Pt denies chest pain, SOB, dizziness, or heart palpitations.  Taking meds as directed w/o problems.  Denies medication side effects.    Impaired fasting glucose-no increased thirst or urination. No symptoms consistent with hypoglycemia.   Past Medical History:  Diagnosis Date  . Allergy   . Hyperlipidemia   . Hypertension   . Migraines    occular    Past Surgical History:  Procedure Laterality Date  . LASIK    . MANDIBLE SURGERY      Family History  Problem Relation Age of Onset  . Cancer Sister   . Parkinson's disease Sister   . Cancer Father        melanoma, died age 60  . Heart disease Father 59       MI  . Heart attack Father   . AAA (abdominal aortic aneurysm) Brother   . Heart disease Brother   . COPD Brother   . Sleep apnea Brother   . Heart disease Brother   . Alzheimer's disease Brother        Lew Body Dementia, 75  . Thyroid disease Mother 13       died 22    Social History   Socioeconomic History  . Marital status: Married    Spouse name: Not on file  . Number of children: Not on file  . Years of education: Not on file  . Highest education level: Not on file  Occupational History  . Not on file  Tobacco Use  . Smoking status: Never Smoker  . Smokeless tobacco: Never Used  Substance and Sexual Activity  . Alcohol use: Yes    Alcohol/week: 10.0 standard drinks    Types: 6 Standard drinks or equivalent, 4 Cans of beer per week    Comment: States he never drinks more than 3 beers at a time.  . Drug use: No  . Sexual activity: Not on file  Other Topics Concern  . Not on file  Social History Narrative  . Not on file   Social Determinants of Health   Financial Resource  Strain:   . Difficulty of Paying Living Expenses: Not on file  Food Insecurity:   . Worried About Charity fundraiser in the Last Year: Not on file  . Ran Out of Food in the Last Year: Not on file  Transportation Needs:   . Lack of Transportation (Medical): Not on file  . Lack of Transportation (Non-Medical): Not on file  Physical Activity:   . Days of Exercise per Week: Not on file  . Minutes of Exercise per Session: Not on file  Stress:   . Feeling of Stress : Not on file  Social Connections:   . Frequency of Communication with Friends and Family: Not on file  . Frequency of Social Gatherings with Friends and Family: Not on file  . Attends Religious Services: Not on file  . Active Member of Clubs or Organizations: Not on file  . Attends Archivist Meetings: Not on file  . Marital Status: Not on file  Intimate Partner Violence:   . Fear of Current or Ex-Partner: Not on file  . Emotionally Abused: Not on file  .  Physically Abused: Not on file  . Sexually Abused: Not on file    Outpatient Medications Prior to Visit  Medication Sig Dispense Refill  . aspirin 81 MG tablet Take 81 mg by mouth daily.    . cholecalciferol (VITAMIN D) 1000 UNITS tablet Take 1,000 Units by mouth daily.    Marland Kitchen co-enzyme Q-10 30 MG capsule Take 30 mg by mouth daily.    . fluticasone (FLONASE) 50 MCG/ACT nasal spray One spray in each nostril twice a day, use left hand for right nostril, and right hand for left nostril. 48 g 3  . Multiple Vitamins-Minerals (MULTIVITAMIN WITH MINERALS) tablet Take 1 tablet by mouth daily.    . Omega-3 Fatty Acids (FISH OIL) 1000 MG CAPS Take by mouth.    . simvastatin (ZOCOR) 40 MG tablet Take 1 tablet (40 mg total) by mouth daily. 90 tablet 3  . valsartan-hydrochlorothiazide (DIOVAN-HCT) 320-12.5 MG tablet TAKE 1 TABLET BY MOUTH  DAILY 90 tablet 3   No facility-administered medications prior to visit.    Allergies  Allergen Reactions  . Atorvastatin      REACTION: myalgias  . Prochlorperazine     Other reaction(s): Other (See Comments) Had reaction ~ > 40 yrs ago, muscle contractions  . Rosuvastatin     REACTION: cause myalgias    ROS Review of Systems    Objective:    Physical Exam  Constitutional: He is oriented to person, place, and time. He appears well-developed and well-nourished.  HENT:  Head: Normocephalic and atraumatic.  Cardiovascular: Normal rate, regular rhythm and normal heart sounds.  Pulmonary/Chest: Effort normal and breath sounds normal.  Neurological: He is alert and oriented to person, place, and time.  Skin: Skin is warm and dry.  Psychiatric: He has a normal mood and affect. His behavior is normal.    BP 111/71   Pulse 69   Ht 5\' 9"  (1.753 m)   Wt 237 lb (107.5 kg)   SpO2 98%   BMI 35.00 kg/m  Wt Readings from Last 3 Encounters:  05/27/19 237 lb (107.5 kg)  11/25/18 234 lb (106.1 kg)  05/25/18 235 lb (106.6 kg)     There are no preventive care reminders to display for this patient.  There are no preventive care reminders to display for this patient.  Lab Results  Component Value Date   TSH 1.652 02/21/2011   No results found for: WBC, HGB, HCT, MCV, PLT Lab Results  Component Value Date   NA 141 11/25/2018   K 4.3 11/25/2018   CO2 27 11/25/2018   GLUCOSE 115 (H) 11/25/2018   BUN 18 11/25/2018   CREATININE 1.02 11/25/2018   BILITOT 1.0 11/25/2018   ALKPHOS 71 07/11/2016   AST 23 11/25/2018   ALT 20 11/25/2018   PROT 6.4 11/25/2018   ALBUMIN 4.3 07/11/2016   CALCIUM 9.2 11/25/2018   Lab Results  Component Value Date   CHOL 153 11/25/2018   Lab Results  Component Value Date   HDL 55 11/25/2018   Lab Results  Component Value Date   LDLCALC 80 11/25/2018   Lab Results  Component Value Date   TRIG 92 11/25/2018   Lab Results  Component Value Date   CHOLHDL 2.8 11/25/2018   Lab Results  Component Value Date   HGBA1C 6.0 (A) 05/27/2019      Assessment & Plan:    Problem List Items Addressed This Visit      Cardiovascular and Mediastinum   HYPERTENSION, BENIGN SYSTEMIC -  Primary   Relevant Orders   BASIC METABOLIC PANEL WITH GFR     Endocrine   IFG (impaired fasting glucose)    Well controlled. Continue current regimen. Follow up in  6 mo      Relevant Orders   POCT HgB A1C (Completed)      No orders of the defined types were placed in this encounter.   Follow-up: Return in about 6 months (around 11/25/2019) for Hypertension.    Beatrice Lecher, MD

## 2019-05-27 NOTE — Assessment & Plan Note (Signed)
Well controlled. Continue current regimen. Follow up in  6 mo  

## 2019-05-28 LAB — BASIC METABOLIC PANEL WITH GFR
BUN: 15 mg/dL (ref 7–25)
CO2: 25 mmol/L (ref 20–32)
Calcium: 9.5 mg/dL (ref 8.6–10.3)
Chloride: 105 mmol/L (ref 98–110)
Creat: 0.85 mg/dL (ref 0.70–1.25)
GFR, Est African American: 103 mL/min/{1.73_m2} (ref >=60)
GFR, Est Non African American: 89 mL/min/{1.73_m2} (ref >=60)
Glucose, Bld: 115 mg/dL — ABNORMAL HIGH (ref 65–99)
Potassium: 4.5 mmol/L (ref 3.5–5.3)
Sodium: 141 mmol/L (ref 135–146)

## 2019-05-28 NOTE — Progress Notes (Signed)
All labs are normal. 

## 2019-06-29 DIAGNOSIS — Z20822 Contact with and (suspected) exposure to covid-19: Secondary | ICD-10-CM | POA: Diagnosis not present

## 2019-07-06 DIAGNOSIS — Z888 Allergy status to other drugs, medicaments and biological substances status: Secondary | ICD-10-CM | POA: Diagnosis not present

## 2019-07-06 DIAGNOSIS — M170 Bilateral primary osteoarthritis of knee: Secondary | ICD-10-CM | POA: Diagnosis not present

## 2019-09-22 DIAGNOSIS — M170 Bilateral primary osteoarthritis of knee: Secondary | ICD-10-CM | POA: Diagnosis not present

## 2019-09-26 ENCOUNTER — Other Ambulatory Visit: Payer: Self-pay | Admitting: Family Medicine

## 2019-09-26 DIAGNOSIS — E785 Hyperlipidemia, unspecified: Secondary | ICD-10-CM

## 2019-11-25 ENCOUNTER — Ambulatory Visit: Payer: Medicare Other | Admitting: Family Medicine

## 2019-12-02 ENCOUNTER — Other Ambulatory Visit: Payer: Self-pay

## 2019-12-02 ENCOUNTER — Encounter: Payer: Self-pay | Admitting: Family Medicine

## 2019-12-02 ENCOUNTER — Ambulatory Visit (INDEPENDENT_AMBULATORY_CARE_PROVIDER_SITE_OTHER): Payer: Medicare Other | Admitting: Family Medicine

## 2019-12-02 VITALS — BP 137/83 | HR 68 | Ht 69.0 in | Wt 239.0 lb

## 2019-12-02 DIAGNOSIS — I10 Essential (primary) hypertension: Secondary | ICD-10-CM

## 2019-12-02 DIAGNOSIS — E785 Hyperlipidemia, unspecified: Secondary | ICD-10-CM | POA: Diagnosis not present

## 2019-12-02 DIAGNOSIS — R7301 Impaired fasting glucose: Secondary | ICD-10-CM

## 2019-12-02 DIAGNOSIS — Z125 Encounter for screening for malignant neoplasm of prostate: Secondary | ICD-10-CM

## 2019-12-02 DIAGNOSIS — I5189 Other ill-defined heart diseases: Secondary | ICD-10-CM | POA: Diagnosis not present

## 2019-12-02 LAB — POCT GLYCOSYLATED HEMOGLOBIN (HGB A1C): Hemoglobin A1C: 6 % — AB (ref 4.0–5.6)

## 2019-12-02 NOTE — Progress Notes (Signed)
Established Patient Office Visit  Subjective:  Patient ID: Zachary Miranda, male    DOB: [birth year]  Age: 69 y.o. MRN: [id number]  CC:  Chief Complaint  Patient presents with  . Hypertension  . ifg    HPI [name] presents for    Hypertension- Pt denies chest pain, SOB, dizziness, or heart palpitations.  Taking meds as directed w/o problems.  Denies medication side effects.    Impaired fasting glucose-no increased thirst or urination. No symptoms consistent with hypoglycemia.   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  He also wanted to discuss his concern for possible coronary artery disease.  Past Medical History:  Diagnosis Date  . Allergy   . Hyperlipidemia   . Hypertension   . Migraines    occular    Past Surgical History:  Procedure Laterality Date  . LASIK    . MANDIBLE SURGERY      Family History  Problem Relation Age of Onset  . Cancer Sister   . Parkinson's disease Sister   . Cancer Father        melanoma, died age 62  . Heart disease Father 69       MI  . Heart attack Father   . AAA (abdominal aortic aneurysm) Brother   . Heart disease Brother   . COPD Brother   . Sleep apnea Brother   . Heart disease Brother   . Alzheimer's disease Brother        Lew Body Dementia, 75  . Thyroid disease Mother 37       died 43    Social History   Socioeconomic History  . Marital status: Married    Spouse name: Not on file  . Number of children: Not on file  . Years of education: Not on file  . Highest education level: Not on file  Occupational History  . Not  on file  Tobacco Use  . Smoking status: Never Smoker  . Smokeless tobacco: Never Used  Substance and Sexual Activity  . Alcohol use: Yes    Alcohol/week: 10.0 standard drinks    Types: 6 Standard drinks or equivalent, 4 Cans of beer per week    Comment: States he never drinks more than 3 beers at a time.  . Drug use: No  . Sexual activity: Not on file  Other Topics Concern  . Not on file  Social History Narrative  . Not on file   Social Determinants of Health   Financial Resource Strain:   . Difficulty of Paying Living Expenses:   Food Insecurity:   . Worried About Charity fundraiser in the Last Year:   . Arboriculturist in the Last Year:   Transportation Needs:   . Film/video editor (Medical):   Marland Kitchen Lack of Transportation (Non-Medical):   Physical Activity:   . Days of Exercise per Week:   . Minutes of Exercise per Session:   Stress:   . Feeling of Stress :   Social Connections:   . Frequency of Communication  with Friends and Family:   . Frequency of Social Gatherings with Friends and Family:   . Attends Religious Services:   . Active Member of Clubs or Organizations:   . Attends Archivist Meetings:   Marland Kitchen Marital Status:   Intimate Partner Violence:   . Fear of Current or Ex-Partner:   . Emotionally Abused:   Marland Kitchen Physically Abused:   . Sexually Abused:     Outpatient Medications Prior to Visit  Medication Sig Dispense Refill  . cholecalciferol (VITAMIN D) 1000 UNITS tablet Take 1,000 Units by mouth daily.    Marland Kitchen co-enzyme Q-10 30 MG capsule Take 30 mg by mouth daily.    . fluticasone (FLONASE) 50 MCG/ACT nasal spray One spray in each nostril twice a day, use left hand for right nostril, and right hand for left nostril. 48 g 3  . Multiple Vitamins-Minerals (MULTIVITAMIN WITH MINERALS) tablet Take 1 tablet by mouth daily.    . Omega-3 Fatty Acids (FISH OIL) 1000 MG CAPS Take by mouth.    . simvastatin (ZOCOR) 40 MG tablet TAKE 1 TABLET BY MOUTH  DAILY 90  tablet 3  . valsartan-hydrochlorothiazide (DIOVAN-HCT) 320-12.5 MG tablet TAKE 1 TABLET BY MOUTH  DAILY 90 tablet 3  . aspirin 81 MG tablet Take 81 mg by mouth daily.     No facility-administered medications prior to visit.    Allergies  Allergen Reactions  . Atorvastatin     REACTION: myalgias  . Prochlorperazine     Other reaction(s): Other (See Comments) Had reaction ~ > 40 yrs ago, muscle contractions  . Rosuvastatin     REACTION: cause myalgias    ROS Review of Systems    Objective:    Physical Exam Constitutional:      Appearance: He is well-developed.  HENT:     Head: Normocephalic and atraumatic.  Cardiovascular:     Rate and Rhythm: Normal rate and regular rhythm.     Heart sounds: Normal heart sounds.  Pulmonary:     Effort: Pulmonary effort is normal.     Breath sounds: Normal breath sounds.  Skin:    General: Skin is warm and dry.  Neurological:     Mental Status: He is alert and oriented to person, place, and time.  Psychiatric:        Behavior: Behavior normal.     BP 137/83   Pulse 68   Ht 5\' 9"  (1.753 m)   Wt 239 lb (108.4 kg)   SpO2 96%   BMI 35.29 kg/m  Wt Readings from Last 3 Encounters:  12/02/19 239 lb (108.4 kg)  05/27/19 237 lb (107.5 kg)  11/25/18 234 lb (106.1 kg)     There are no preventive care reminders to display for this patient.  There are no preventive care reminders to display for this patient.  Lab Results  Component Value Date   TSH 1.652 02/21/2011   No results found for: WBC, HGB, HCT, MCV, PLT Lab Results  Component Value Date   NA 141 05/27/2019   K 4.5 05/27/2019   CO2 25 05/27/2019   GLUCOSE 115 (H) 05/27/2019   BUN 15 05/27/2019   CREATININE 0.85 05/27/2019   BILITOT 1.0 11/25/2018   ALKPHOS 71 07/11/2016   AST 23 11/25/2018   ALT 20 11/25/2018   PROT 6.4 11/25/2018   ALBUMIN 4.3 07/11/2016   CALCIUM 9.5 05/27/2019   Lab Results  Component Value Date   CHOL 153 11/25/2018   Lab Results  Component Value Date   HDL 55 11/25/2018   Lab Results  Component Value Date   LDLCALC 80 11/25/2018   Lab Results  Component Value Date   TRIG 92 11/25/2018   Lab Results  Component Value Date   CHOLHDL 2.8 11/25/2018   Lab Results  Component Value Date   HGBA1C 6.0 (A) 12/02/2019      Assessment & Plan:   Problem List Items Addressed This Visit      Cardiovascular and Mediastinum   HYPERTENSION, BENIGN SYSTEMIC    Well controlled. Continue current regimen. Follow up in  6 mo      Relevant Orders   COMPLETE METABOLIC PANEL WITH GFR   Lipid panel   PSA   Familial heart disease    Both brothers with coronary artery disease.  One had stents in his 85s and the other had bypass in his late 7s early 23s.  He is concerned about his possibility of heart disease.  He is not currently having any active problems or chest pain.  We will consult with cardiology for further work-up consider coronary calcium scoring versus stress test.  Will refer to Dr. Stanford Breed.      Relevant Orders   Ambulatory referral to Cardiology     Endocrine   IFG (impaired fasting glucose) - Primary    Well controlled. Continue current regimen. Follow up in  6 mo.  Lab Results  Component Value Date   HGBA1C 6.0 (A) 12/02/2019         Relevant Orders   POCT glycosylated hemoglobin (Hb A1C) (Completed)   COMPLETE METABOLIC PANEL WITH GFR   Lipid panel   PSA     Other   Hyperlipidemia    Rating statin well.  Continue current regimen.  Due for liver enzyme and lipid levels.       Other Visit Diagnoses    Screening for prostate cancer       Relevant Orders   PSA      No orders of the defined types were placed in this encounter.   Follow-up: Return in about 6 months (around 06/02/2020) for Hypertension adn A1C.    Beatrice Lecher, MD

## 2019-12-02 NOTE — Assessment & Plan Note (Signed)
Well controlled. Continue current regimen. Follow up in  6 mo  

## 2019-12-02 NOTE — Assessment & Plan Note (Signed)
Rating statin well.  Continue current regimen.  Due for liver enzyme and lipid levels.

## 2019-12-02 NOTE — Assessment & Plan Note (Addendum)
Well controlled. Continue current regimen. Follow up in  6 mo.  Lab Results  Component Value Date   HGBA1C 6.0 (A) 12/02/2019

## 2019-12-02 NOTE — Assessment & Plan Note (Signed)
Both brothers with coronary artery disease.  One had stents in his 96s and the other had bypass in his late 37s early 3s.  He is concerned about his possibility of heart disease.  He is not currently having any active problems or chest pain.  We will consult with cardiology for further work-up consider coronary calcium scoring versus stress test.  Will refer to Dr. Stanford Breed.

## 2019-12-03 LAB — LIPID PANEL
Cholesterol: 183 mg/dL (ref <200)
HDL: 55 mg/dL (ref >=40)
LDL Cholesterol (Calc): 109 mg/dL (calc) — ABNORMAL HIGH
Non-HDL Cholesterol (Calc): 128 mg/dL (calc) (ref <130)
Total CHOL/HDL Ratio: 3.3 (calc) (ref <5.0)
Triglycerides: 99 mg/dL (ref <150)

## 2019-12-03 LAB — COMPLETE METABOLIC PANEL WITH GFR
AG Ratio: 2.2 (calc) (ref 1.0–2.5)
ALT: 18 U/L (ref 9–46)
AST: 18 U/L (ref 10–35)
Albumin: 4.6 g/dL (ref 3.6–5.1)
Alkaline phosphatase (APISO): 65 U/L (ref 35–144)
BUN: 16 mg/dL (ref 7–25)
CO2: 30 mmol/L (ref 20–32)
Calcium: 9.7 mg/dL (ref 8.6–10.3)
Chloride: 106 mmol/L (ref 98–110)
Creat: 0.94 mg/dL (ref 0.70–1.18)
GFR, Est African American: 95 mL/min/{1.73_m2} (ref >=60)
GFR, Est Non African American: 82 mL/min/{1.73_m2} (ref >=60)
Globulin: 2.1 g/dL (calc) (ref 1.9–3.7)
Glucose, Bld: 113 mg/dL — ABNORMAL HIGH (ref 65–99)
Potassium: 4.6 mmol/L (ref 3.5–5.3)
Sodium: 143 mmol/L (ref 135–146)
Total Bilirubin: 0.8 mg/dL (ref 0.2–1.2)
Total Protein: 6.7 g/dL (ref 6.1–8.1)

## 2019-12-03 LAB — PSA: PSA: 1.8 ng/mL (ref <=4.0)

## 2019-12-06 ENCOUNTER — Ambulatory Visit: Payer: Medicare Other | Admitting: Cardiology

## 2019-12-06 ENCOUNTER — Encounter: Payer: Self-pay | Admitting: Cardiology

## 2019-12-06 ENCOUNTER — Other Ambulatory Visit: Payer: Self-pay

## 2019-12-06 VITALS — BP 130/78 | HR 91 | Ht 70.0 in | Wt 240.8 lb

## 2019-12-06 DIAGNOSIS — I10 Essential (primary) hypertension: Secondary | ICD-10-CM | POA: Diagnosis not present

## 2019-12-06 DIAGNOSIS — R0989 Other specified symptoms and signs involving the circulatory and respiratory systems: Secondary | ICD-10-CM

## 2019-12-06 DIAGNOSIS — I5189 Other ill-defined heart diseases: Secondary | ICD-10-CM | POA: Diagnosis not present

## 2019-12-06 DIAGNOSIS — E7800 Pure hypercholesterolemia, unspecified: Secondary | ICD-10-CM

## 2019-12-06 NOTE — Patient Instructions (Signed)
Medication Instructions:  NO CHANGE *If you need a refill on your cardiac medications before your next appointment, please call your pharmacy*   Lab Work: If you have labs (blood work) drawn today and your tests are completely normal, you will receive your results only by: Marland Kitchen MyChart Message (if you have MyChart) OR . A paper copy in the mail If you have any lab test that is abnormal or we need to change your treatment, we will call you to review the results.   Testing/Procedures:  Your physician has requested that you have an echocardiogram. Echocardiography is a painless test that uses sound waves to create images of your heart. It provides your doctor with information about the size and shape of your heart and how well your heart's chambers and valves are working. This procedure takes approximately one hour. There are no restrictions for this procedure.[place]  Your physician has requested that you have an abdominal aorta duplex. During this test, an ultrasound is used to evaluate the aorta. Allow 30 minutes for this exam. Do not eat after midnight the day before and avoid carbonated beverages [facility]    Follow-Up: At [facility], you and your health needs are our priority.  As part of our continuing mission to provide you with exceptional heart care, we have created designated Provider Care Teams.  These Care Teams include your primary Cardiologist (physician) and Advanced Practice Providers (APPs -  Physician Assistants and Nurse Practitioners) who all work together to provide you with the care you need, when you need it.  We recommend signing up for the patient portal called "MyChart".  Sign up information is provided on this After Visit Summary.  MyChart is used to connect with patients for Virtual Visits (Telemedicine).  Patients are able to view lab/test results, encounter notes, upcoming appointments, etc.   Non-urgent messages can be sent to your provider as well.   To learn more about what you can do with MyChart, go to [email or web address].    Your next appointment:   6 month(s)  The format for your next appointment:   Either In Person or Virtual  Provider:   You may see Kirk Ruths MD or one of the following Advanced Practice Providers on your designated Care Team:    Kerin Ransom, PA-C  [place], [place]  Coletta Memos, [place]

## 2019-12-06 NOTE — Progress Notes (Signed)
Referring-Zachary Miranda Reason for referral-hypertension and family history of coronary artery disease  HPI: 69 year old male for evaluation of hypertension and family history of coronary artery disease at request of Beatrice Lecher Miranda. Patient has 2 brothers 1 who had stents in his 41s and the other had coronary artery bypass graft in his late 68s early 67s.  Laboratories 2021 showed LDL 109.  Patient has dyspnea with more vigorous activities but not routine activities.  No orthopnea, PND, pedal edema, exertional chest pain or syncope.  No claudication.  Current Outpatient Medications  Medication Sig Dispense Refill  . cholecalciferol (VITAMIN D) 1000 UNITS tablet Take 1,000 Units by mouth daily.    Marland Kitchen co-enzyme Q-10 30 MG capsule Take 30 mg by mouth daily.    . fluticasone (FLONASE) 50 MCG/ACT nasal spray One spray in each nostril twice a day, use left hand for right nostril, and right hand for left nostril. 48 g 3  . Multiple Vitamins-Minerals (MULTIVITAMIN WITH MINERALS) tablet Take 1 tablet by mouth daily.    . Omega-3 Fatty Acids (FISH OIL) 1000 MG CAPS Take by mouth.    . simvastatin (ZOCOR) 40 MG tablet TAKE 1 TABLET BY MOUTH  DAILY 90 tablet 3  . valsartan-hydrochlorothiazide (DIOVAN-HCT) 320-12.5 MG tablet TAKE 1 TABLET BY MOUTH  DAILY 90 tablet 3   No current facility-administered medications for this visit.    Allergies  Allergen Reactions  . Atorvastatin     REACTION: myalgias  . Prochlorperazine     Other reaction(s): Other (See Comments) Had reaction ~ > 40 yrs ago, muscle contractions  . Rosuvastatin     REACTION: cause myalgias     Past Medical History:  Diagnosis Date  . Allergy   . Hyperlipidemia   . Hypertension   . Migraines    occular    Past Surgical History:  Procedure Laterality Date  . LASIK    . MANDIBLE SURGERY      Social History   Socioeconomic History  . Marital status: Married    Spouse name: Not on file  . Number of  children: Not on file  . Years of education: Not on file  . Highest education level: Not on file  Occupational History  . Not on file  Tobacco Use  . Smoking status: Never Smoker  . Smokeless tobacco: Never Used  Substance and Sexual Activity  . Alcohol use: Yes    Alcohol/week: 10.0 standard drinks    Types: 6 Standard drinks or equivalent, 4 Cans of beer per week    Comment: States he never drinks more than 3 beers at a time.  . Drug use: No  . Sexual activity: Not on file  Other Topics Concern  . Not on file  Social History Narrative  . Not on file   Social Determinants of Health   Financial Resource Strain:   . Difficulty of Paying Living Expenses:   Food Insecurity:   . Worried About Charity fundraiser in the Last Year:   . Arboriculturist in the Last Year:   Transportation Needs:   . Film/video editor (Medical):   Marland Kitchen Lack of Transportation (Non-Medical):   Physical Activity:   . Days of Exercise per Week:   . Minutes of Exercise per Session:   Stress:   . Feeling of Stress :   Social Connections:   . Frequency of Communication with Friends and Family:   . Frequency of Social Gatherings with Friends  and Family:   . Attends Religious Services:   . Active Member of Clubs or Organizations:   . Attends Archivist Meetings:   Marland Kitchen Marital Status:   Intimate Partner Violence:   . Fear of Current or Ex-Partner:   . Emotionally Abused:   Marland Kitchen Physically Abused:   . Sexually Abused:     Family History  Problem Relation Age of Onset  . Cancer Sister   . Parkinson's disease Sister   . Cancer Father        melanoma, died age 22  . Heart disease Father 41       MI  . Heart attack Father   . AAA (abdominal aortic aneurysm) Brother   . Heart disease Brother   . COPD Brother   . Sleep apnea Brother   . Heart disease Brother   . Alzheimer's disease Brother        Lew Body Dementia, 75  . Thyroid disease Mother 44       died 9    ROS: no fevers or  chills, productive cough, hemoptysis, dysphasia, odynophagia, melena, hematochezia, dysuria, hematuria, rash, seizure activity, and have been already, orthopnea, PND, pedal edema, claudication. Remaining systems are negative.  Physical Exam:   Blood pressure 130/78, pulse 91, height 5\' 10"  (1.778 m), weight 240 lb 12.8 oz (109.2 kg), SpO2 96 %.  General:  Well developed/well nourished in NAD Skin warm/dry Patient not depressed No peripheral clubbing Back-normal HEENT-normal/normal eyelids Neck supple/normal carotid upstroke bilaterally; no bruits; no JVD; no thyromegaly chest - CTA/ normal expansion CV - RRR/normal S1 and S2; no murmurs, rubs or gallops;  PMI nondisplaced Abdomen -NT/ND, no HSM, no mass, + bowel sounds, no bruit 2+ femoral pulses, no bruits Ext-no edema, chords, 2+ DP Neuro-grossly nonfocal  ECG -sinus rhythm at a rate of 91, nonspecific ST changes.  Cannot rule out prior inferior infarct.  personally reviewed  A/P  1 family history of coronary artery disease-strong family history of coronary artery disease.  We will arrange calcium score for risk stratification.  If significantly abnormal will consider adding aspirin 81 mg daily and being more aggressive with lipids.  2 hypertension-blood pressure controlled.  Continue present medications.  3 hyperlipidemia-we will continue Zocor for now.  He had myalgias with Crestor and Lipitor previously.  We could add Zetia if his calcium score significantly elevated.  4 family history of abdominal aortic aneurysm-we will arrange ultrasound for screening purposes.  Kirk Ruths, Miranda

## 2019-12-23 ENCOUNTER — Ambulatory Visit (HOSPITAL_COMMUNITY): Payer: Medicare Other | Attending: Cardiology

## 2019-12-23 ENCOUNTER — Other Ambulatory Visit: Payer: Self-pay

## 2019-12-23 ENCOUNTER — Ambulatory Visit
Admission: RE | Admit: 2019-12-23 | Discharge: 2019-12-23 | Disposition: A | Discharge status: Home / Self Care | Payer: Self-pay | Source: Ambulatory Visit | Attending: Cardiology | Admitting: Cardiology

## 2019-12-23 DIAGNOSIS — I5189 Other ill-defined heart diseases: Secondary | ICD-10-CM | POA: Insufficient documentation

## 2019-12-23 DIAGNOSIS — R918 Other nonspecific abnormal finding of lung field: Secondary | ICD-10-CM | POA: Diagnosis not present

## 2019-12-24 ENCOUNTER — Telehealth: Payer: Self-pay | Admitting: *Deleted

## 2019-12-24 DIAGNOSIS — E7800 Pure hypercholesterolemia, unspecified: Secondary | ICD-10-CM

## 2019-12-24 MED ORDER — EZETIMIBE 10 MG PO TABS: 10.0000 mg | ORAL_TABLET | Freq: Every day | ORAL | 3 refills | Status: DC

## 2019-12-24 NOTE — Telephone Encounter (Signed)
Spoke with Zachary Miranda, Aware of dr crenshaw's recommendations. New script sent to the pharmacy and Lab orders mailed to the Zachary Miranda  

## 2019-12-24 NOTE — Addendum Note (Signed)
Addended by: Cristopher Estimable on: 12/24/2019 11:19 AM   Modules accepted: Orders

## 2019-12-24 NOTE — Telephone Encounter (Signed)
Follow Up  Patient is returning call. Transferred call to [facility]

## 2019-12-24 NOTE — Telephone Encounter (Addendum)
-----   Message from Lelon Perla, MD sent at 12/23/2019  2:43 PM EDT ----- Mildly elevated Ca score; LDL not at goal; continue zocor; add zetia 10 mg daily; lipids and liver 12 weeks; add ASA 81 mg daily; repeat noncontrast CT 12 months for dilated aorta Kirk Ruths  Noncontrast chest CT 12 months  Kirk Ruths   Normal LV function; aortic root mildly dilated; fu echo one year  Kirk Ruths    Left message for pt to call

## 2019-12-28 ENCOUNTER — Other Ambulatory Visit: Payer: Self-pay

## 2019-12-28 ENCOUNTER — Ambulatory Visit (HOSPITAL_COMMUNITY)
Admission: RE | Admit: 2019-12-28 | Discharge: 2019-12-28 | Disposition: A | Discharge status: Home / Self Care | Payer: Medicare Other | Source: Ambulatory Visit | Attending: Cardiology | Admitting: Cardiology

## 2019-12-28 DIAGNOSIS — R0989 Other specified symptoms and signs involving the circulatory and respiratory systems: Secondary | ICD-10-CM | POA: Diagnosis not present

## 2019-12-28 DIAGNOSIS — Z136 Encounter for screening for cardiovascular disorders: Secondary | ICD-10-CM | POA: Insufficient documentation

## 2019-12-28 DIAGNOSIS — E785 Hyperlipidemia, unspecified: Secondary | ICD-10-CM | POA: Insufficient documentation

## 2019-12-28 DIAGNOSIS — I10 Essential (primary) hypertension: Secondary | ICD-10-CM | POA: Diagnosis not present

## 2019-12-28 DIAGNOSIS — Z8489 Family history of other specified conditions: Secondary | ICD-10-CM | POA: Insufficient documentation

## 2020-03-13 DIAGNOSIS — R109 Unspecified abdominal pain: Secondary | ICD-10-CM | POA: Diagnosis not present

## 2020-03-13 DIAGNOSIS — Z20822 Contact with and (suspected) exposure to covid-19: Secondary | ICD-10-CM | POA: Diagnosis not present

## 2020-03-13 DIAGNOSIS — R197 Diarrhea, unspecified: Secondary | ICD-10-CM | POA: Diagnosis not present

## 2020-04-05 DIAGNOSIS — E7800 Pure hypercholesterolemia, unspecified: Secondary | ICD-10-CM | POA: Diagnosis not present

## 2020-04-06 ENCOUNTER — Encounter: Payer: Self-pay | Admitting: *Deleted

## 2020-04-06 LAB — HEPATIC FUNCTION PANEL
AG Ratio: 2.1 (calc) (ref 1.0–2.5)
ALT: 18 U/L (ref 9–46)
AST: 19 U/L (ref 10–35)
Albumin: 4.2 g/dL (ref 3.6–5.1)
Alkaline phosphatase (APISO): 64 U/L (ref 35–144)
Bilirubin, Direct: 0.2 mg/dL (ref 0.0–0.2)
Globulin: 2 g/dL (calc) (ref 1.9–3.7)
Indirect Bilirubin: 0.6 mg/dL (calc) (ref 0.2–1.2)
Total Bilirubin: 0.8 mg/dL (ref 0.2–1.2)
Total Protein: 6.2 g/dL (ref 6.1–8.1)

## 2020-04-06 LAB — LIPID PANEL
Cholesterol: 133 mg/dL (ref <200)
HDL: 53 mg/dL (ref >=40)
LDL Cholesterol (Calc): 62 mg/dL (calc)
Non-HDL Cholesterol (Calc): 80 mg/dL (calc) (ref <130)
Total CHOL/HDL Ratio: 2.5 (calc) (ref <5.0)
Triglycerides: 94 mg/dL (ref <150)

## 2020-04-13 DIAGNOSIS — H43811 Vitreous degeneration, right eye: Secondary | ICD-10-CM | POA: Diagnosis not present

## 2020-04-13 DIAGNOSIS — H2510 Age-related nuclear cataract, unspecified eye: Secondary | ICD-10-CM | POA: Diagnosis not present

## 2020-04-13 DIAGNOSIS — H524 Presbyopia: Secondary | ICD-10-CM | POA: Diagnosis not present

## 2020-05-19 NOTE — Progress Notes (Signed)
HPI: FU hypertension and family history of coronary artery disease. Patient has 2 brothers 1 who had stents in his 74s and the other had coronary artery bypass graft in his late 7s early 42s. Echocardiogram July 2021 showed normal LV function, grade 1 diastolic dysfunction and trace aortic insufficiency. The aorta was mildly dilated at 40 mm. Abdominal ultrasound July 2021 showed no aneurysm. Calcium score July 2021 371 which was 67 percentile. There was also note of mildly dilated aortic root at 40 mm.  There was also note of small pulmonary nodules and follow-up chest CT could be considered 12 months.  Since last seen, there is no dyspnea, chest pain, palpitations or syncope.  Current Outpatient Medications  Medication Sig Dispense Refill  . cholecalciferol (VITAMIN D) 1000 UNITS tablet Take 1,000 Units by mouth daily.    Marland Kitchen co-enzyme Q-10 30 MG capsule Take 30 mg by mouth daily.    Marland Kitchen ezetimibe (ZETIA) 10 MG tablet Take 1 tablet (10 mg total) by mouth daily. 90 tablet 3  . fluticasone (FLONASE) 50 MCG/ACT nasal spray One spray in each nostril twice a day, use left hand for right nostril, and right hand for left nostril. 48 g 3  . Multiple Vitamins-Minerals (MULTIVITAMIN WITH MINERALS) tablet Take 1 tablet by mouth daily.    . Omega-3 Fatty Acids (FISH OIL) 1000 MG CAPS Take by mouth.    . simvastatin (ZOCOR) 40 MG tablet TAKE 1 TABLET BY MOUTH  DAILY 90 tablet 3  . valsartan-hydrochlorothiazide (DIOVAN-HCT) 320-12.5 MG tablet TAKE 1 TABLET BY MOUTH  DAILY 90 tablet 3   No current facility-administered medications for this visit.     Past Medical History:  Diagnosis Date  . Allergy   . Hyperlipidemia   . Hypertension   . Migraines    occular    Past Surgical History:  Procedure Laterality Date  . LASIK    . MANDIBLE SURGERY      Social History   Socioeconomic History  . Marital status: Married    Spouse name: Not on file  . Number of children: Not on file  . Years of  education: Not on file  . Highest education level: Not on file  Occupational History  . Not on file  Tobacco Use  . Smoking status: Never Smoker  . Smokeless tobacco: Never Used  Substance and Sexual Activity  . Alcohol use: Yes    Alcohol/week: 10.0 standard drinks    Types: 6 Standard drinks or equivalent, 4 Cans of beer per week    Comment: States he never drinks more than 3 beers at a time.  . Drug use: No  . Sexual activity: Not on file  Other Topics Concern  . Not on file  Social History Narrative  . Not on file   Social Determinants of Health   Financial Resource Strain: Not on file  Food Insecurity: Not on file  Transportation Needs: Not on file  Physical Activity: Not on file  Stress: Not on file  Social Connections: Not on file  Intimate Partner Violence: Not on file    Family History  Problem Relation Age of Onset  . Cancer Sister   . Parkinson's disease Sister   . Cancer Father        melanoma, died age 36  . Heart disease Father 34       MI  . Heart attack Father   . AAA (abdominal aortic aneurysm) Brother   . Heart disease  Brother   . COPD Brother   . Sleep apnea Brother   . Heart disease Brother   . Alzheimer's disease Brother        Lew Body Dementia, 75  . Thyroid disease Mother 28       died 62    ROS: no fevers or chills, productive cough, hemoptysis, dysphasia, odynophagia, melena, hematochezia, dysuria, hematuria, rash, seizure activity, orthopnea, PND, pedal edema, claudication. Remaining systems are negative.  Physical Exam: Well-developed well-nourished in no acute distress.  Skin is warm and dry.  HEENT is normal.  Neck is supple.  Chest is clear to auscultation with normal expansion.  Cardiovascular exam is regular rate and rhythm.  Abdominal exam nontender or distended. No masses palpated. Extremities show no edema. neuro grossly intact  A/P  1 coronary artery disease-based on previous elevated calcium score.  Continue  aspirin and statin.  Patient denies chest pain.  2 family history of abdominal aortic aneurysm-previous ultrasound showed no aneurysm.  3 hypertension-patient's blood pressure is controlled.  Continue present medications and follow-up.  4 hyperlipidemia-continue Zocor and Zetia.  He did not tolerate Crestor or Lipitor previously.  Laboratories from October 2021 showed LDL 62 and liver functions normal.  5 pulmonary nodules-we will arrange follow-up CT July 2022.  This will also reassess mildly dilated aortic root.  Kirk Ruths, MD

## 2020-05-30 ENCOUNTER — Other Ambulatory Visit: Payer: Self-pay

## 2020-05-30 ENCOUNTER — Encounter: Payer: Self-pay | Admitting: Cardiology

## 2020-05-30 ENCOUNTER — Ambulatory Visit: Payer: Medicare Other | Admitting: Cardiology

## 2020-05-30 VITALS — BP 118/68 | HR 74 | Ht 70.0 in | Wt 237.8 lb

## 2020-05-30 DIAGNOSIS — I5189 Other ill-defined heart diseases: Secondary | ICD-10-CM

## 2020-05-30 DIAGNOSIS — E7800 Pure hypercholesterolemia, unspecified: Secondary | ICD-10-CM

## 2020-05-30 DIAGNOSIS — I10 Essential (primary) hypertension: Secondary | ICD-10-CM

## 2020-05-30 NOTE — Patient Instructions (Signed)

## 2020-06-04 ENCOUNTER — Other Ambulatory Visit: Payer: Self-pay | Admitting: Family Medicine

## 2020-06-04 DIAGNOSIS — I10 Essential (primary) hypertension: Secondary | ICD-10-CM

## 2020-06-05 ENCOUNTER — Encounter: Payer: Self-pay | Admitting: Family Medicine

## 2020-06-05 ENCOUNTER — Other Ambulatory Visit: Payer: Self-pay

## 2020-06-05 ENCOUNTER — Ambulatory Visit (INDEPENDENT_AMBULATORY_CARE_PROVIDER_SITE_OTHER): Payer: Medicare Other | Admitting: Family Medicine

## 2020-06-05 VITALS — BP 109/63 | HR 71 | Ht 70.0 in | Wt 238.0 lb

## 2020-06-05 DIAGNOSIS — R7301 Impaired fasting glucose: Secondary | ICD-10-CM

## 2020-06-05 DIAGNOSIS — I5189 Other ill-defined heart diseases: Secondary | ICD-10-CM

## 2020-06-05 DIAGNOSIS — I10 Essential (primary) hypertension: Secondary | ICD-10-CM | POA: Diagnosis not present

## 2020-06-05 LAB — BASIC METABOLIC PANEL WITH GFR
BUN: 16 mg/dL (ref 7–25)
CO2: 29 mmol/L (ref 20–32)
Calcium: 9.5 mg/dL (ref 8.6–10.3)
Chloride: 106 mmol/L (ref 98–110)
Creat: 0.96 mg/dL (ref 0.70–1.18)
GFR, Est African American: 92 mL/min/{1.73_m2} (ref >=60)
GFR, Est Non African American: 80 mL/min/{1.73_m2} (ref >=60)
Glucose, Bld: 111 mg/dL — ABNORMAL HIGH (ref 65–99)
Potassium: 4.5 mmol/L (ref 3.5–5.3)
Sodium: 142 mmol/L (ref 135–146)

## 2020-06-05 LAB — POCT GLYCOSYLATED HEMOGLOBIN (HGB A1C): Hemoglobin A1C: 6.1 % — AB (ref 4.0–5.6)

## 2020-06-05 NOTE — Assessment & Plan Note (Signed)
He recently saw cardiology and got a good checkup blood pressure looked great when he was there he just really encouraged him to start getting back on track with exercising for 30 minutes a day.  He admits he had gotten off track a little bit with that.  He is tolerating the simvastatin and Zetia well in fact he just had repeat lipids in October.

## 2020-06-05 NOTE — Assessment & Plan Note (Signed)
One see looks great today at 6.1.  Continue work on [employer] and regular exercise.  He is planning on getting back on track with his exercise routine.

## 2020-06-05 NOTE — Progress Notes (Signed)
Established Patient Office Visit  Subjective:  Patient ID: Zachary Miranda, male    DOB: 1951-02-09  Age: 69 y.o. MRN: [id number]  CC:  Chief Complaint  Patient presents with  . Hypertension    HPI BANDY HONAKER presents for   Hypertension- Pt denies chest pain, SOB, dizziness, or heart palpitations.  Taking meds as directed w/o problems.  Denies medication side effects.    Impaired fasting glucose-no increased thirst or urination. No symptoms consistent with hypoglycemia.   Past Medical History:  Diagnosis Date  . Allergy   . Hyperlipidemia   . Hypertension   . Migraines    occular    Past Surgical History:  Procedure Laterality Date  . LASIK    . MANDIBLE SURGERY      Family History  Problem Relation Age of Onset  . Cancer Sister   . Parkinson's disease Sister   . Cancer Father        melanoma, died age 37  . Heart disease Father 82       MI  . Heart attack Father   . AAA (abdominal aortic aneurysm) Brother   . Heart disease Brother   . COPD Brother   . Sleep apnea Brother   . Heart disease Brother   . Alzheimer's disease Brother        Lew Body Dementia, 75  . Thyroid disease Mother 19       died 81    Social History   Socioeconomic History  . Marital status: Married    Spouse name: Not on file  . Number of children: Not on file  . Years of education: Not on file  . Highest education level: Not on file  Occupational History  . Not on file  Tobacco Use  . Smoking status: Never Smoker  . Smokeless tobacco: Never Used  Substance and Sexual Activity  . Alcohol use: Yes    Alcohol/week: 10.0 standard drinks    Types: 6 Standard drinks or equivalent, 4 Cans of beer per week    Comment: States he never drinks more than 3 beers at a time.  . Drug use: No  . Sexual activity: Not on file  Other Topics Concern  . Not on file  Social History Narrative  . Not on file   Social Determinants of Health   Financial Resource Strain: Not  on file  Food Insecurity: Not on file  Transportation Needs: Not on file  Physical Activity: Not on file  Stress: Not on file  Social Connections: Not on file  Intimate Partner Violence: Not on file    Outpatient Medications Prior to Visit  Medication Sig Dispense Refill  . cholecalciferol (VITAMIN D) 1000 UNITS tablet Take 1,000 Units by mouth daily.    Marland Kitchen co-enzyme Q-10 30 MG capsule Take 30 mg by mouth daily.    Marland Kitchen ezetimibe (ZETIA) 10 MG tablet Take 1 tablet (10 mg total) by mouth daily. 90 tablet 3  . fluticasone (FLONASE) 50 MCG/ACT nasal spray One spray in each nostril twice a day, use left hand for right nostril, and right hand for left nostril. 48 g 3  . Multiple Vitamins-Minerals (MULTIVITAMIN WITH MINERALS) tablet Take 1 tablet by mouth daily.    . Omega-3 Fatty Acids (FISH OIL) 1000 MG CAPS Take by mouth.    . simvastatin (ZOCOR) 40 MG tablet TAKE 1 TABLET BY MOUTH  DAILY 90 tablet 3  . valsartan-hydrochlorothiazide (DIOVAN-HCT) 320-12.5 MG tablet TAKE 1 TABLET BY  MOUTH  DAILY 90 tablet 3   No facility-administered medications prior to visit.    Allergies  Allergen Reactions  . Atorvastatin     REACTION: myalgias  . Prochlorperazine     Other reaction(s): Other (See Comments) Had reaction ~ > 40 yrs ago, muscle contractions  . Rosuvastatin     REACTION: cause myalgias    ROS Review of Systems    Objective:    Physical Exam Constitutional:      Appearance: He is well-developed and well-nourished.  HENT:     Head: Normocephalic and atraumatic.  Cardiovascular:     Rate and Rhythm: Normal rate and regular rhythm.     Heart sounds: Normal heart sounds.  Pulmonary:     Effort: Pulmonary effort is normal.     Breath sounds: Normal breath sounds.  Skin:    General: Skin is warm and dry.  Neurological:     Mental Status: He is alert and oriented to person, place, and time.  Psychiatric:        Mood and Affect: Mood and affect normal.        Behavior:  Behavior normal.     BP 109/63   Pulse 71   Ht 5\' 10"  (1.778 m)   Wt 238 lb (108 kg)   SpO2 99%   BMI 34.15 kg/m  Wt Readings from Last 3 Encounters:  06/05/20 238 lb (108 kg)  05/30/20 237 lb 12.8 oz (107.9 kg)  12/06/19 240 lb 12.8 oz (109.2 kg)     There are no preventive care reminders to display for this patient.  There are no preventive care reminders to display for this patient.  Lab Results  Component Value Date   TSH 1.652 02/21/2011   No results found for: WBC, HGB, HCT, MCV, PLT Lab Results  Component Value Date   NA 143 12/02/2019   K 4.6 12/02/2019   CO2 30 12/02/2019   GLUCOSE 113 (H) 12/02/2019   BUN 16 12/02/2019   CREATININE 0.94 12/02/2019   BILITOT 0.8 04/05/2020   ALKPHOS 71 07/11/2016   AST 19 04/05/2020   ALT 18 04/05/2020   PROT 6.2 04/05/2020   ALBUMIN 4.3 07/11/2016   CALCIUM 9.7 12/02/2019   Lab Results  Component Value Date   CHOL 133 04/05/2020   Lab Results  Component Value Date   HDL 53 04/05/2020   Lab Results  Component Value Date   LDLCALC 62 04/05/2020   Lab Results  Component Value Date   TRIG 94 04/05/2020   Lab Results  Component Value Date   CHOLHDL 2.5 04/05/2020   Lab Results  Component Value Date   HGBA1C 6.1 (A) 06/05/2020      Assessment & Plan:   Problem List Items Addressed This Visit      Cardiovascular and Mediastinum   HYPERTENSION, BENIGN SYSTEMIC    Well controlled. Continue current regimen. Follow up in  6 mo. Due for BMP      Relevant Orders   BASIC METABOLIC PANEL WITH GFR   Familial heart disease    He recently saw cardiology and got a good checkup blood pressure looked great when he was there he just really encouraged him to start getting back on track with exercising for 30 minutes a day.  He admits he had gotten off track a little bit with that.  He is tolerating the simvastatin and Zetia well in fact he just had repeat lipids in October.  Endocrine   IFG (impaired  fasting glucose) - Primary    One see looks great today at 6.1.  Continue work on [employer] and regular exercise.  He is planning on getting back on track with his exercise routine.      Relevant Orders   POCT glycosylated hemoglobin (Hb A1C) (Completed)   BASIC METABOLIC PANEL WITH GFR      No orders of the defined types were placed in this encounter.   Follow-up: Return in about 6 months (around 12/04/2020) for Hypertension and A1C.    Beatrice Lecher, MD

## 2020-06-05 NOTE — Assessment & Plan Note (Signed)
Well controlled. Continue current regimen. Follow up in  6 mo . Due for BMP 

## 2020-06-14 ENCOUNTER — Encounter: Payer: Self-pay | Admitting: *Deleted

## 2020-10-04 ENCOUNTER — Other Ambulatory Visit: Payer: Self-pay | Admitting: Cardiology

## 2020-10-04 DIAGNOSIS — E7800 Pure hypercholesterolemia, unspecified: Secondary | ICD-10-CM

## 2020-12-04 ENCOUNTER — Ambulatory Visit: Payer: Medicare Other | Admitting: Family Medicine

## 2020-12-12 ENCOUNTER — Encounter: Payer: Self-pay | Admitting: *Deleted

## 2020-12-12 DIAGNOSIS — R918 Other nonspecific abnormal finding of lung field: Secondary | ICD-10-CM

## 2020-12-13 ENCOUNTER — Ambulatory Visit: Payer: Medicare Other | Admitting: Family Medicine

## 2020-12-25 ENCOUNTER — Ambulatory Visit (INDEPENDENT_AMBULATORY_CARE_PROVIDER_SITE_OTHER): Payer: Medicare Other | Admitting: Family Medicine

## 2020-12-25 ENCOUNTER — Encounter: Payer: Self-pay | Admitting: Family Medicine

## 2020-12-25 ENCOUNTER — Other Ambulatory Visit: Payer: Self-pay

## 2020-12-25 VITALS — BP 116/64 | HR 66 | Ht 70.0 in | Wt 237.0 lb

## 2020-12-25 DIAGNOSIS — R413 Other amnesia: Secondary | ICD-10-CM | POA: Insufficient documentation

## 2020-12-25 DIAGNOSIS — Z6831 Body mass index (BMI) 31.0-31.9, adult: Secondary | ICD-10-CM | POA: Insufficient documentation

## 2020-12-25 DIAGNOSIS — Z6834 Body mass index (BMI) 34.0-34.9, adult: Secondary | ICD-10-CM | POA: Insufficient documentation

## 2020-12-25 DIAGNOSIS — Z125 Encounter for screening for malignant neoplasm of prostate: Secondary | ICD-10-CM | POA: Diagnosis not present

## 2020-12-25 DIAGNOSIS — I10 Essential (primary) hypertension: Secondary | ICD-10-CM | POA: Diagnosis not present

## 2020-12-25 DIAGNOSIS — R7301 Impaired fasting glucose: Secondary | ICD-10-CM | POA: Diagnosis not present

## 2020-12-25 LAB — POCT GLYCOSYLATED HEMOGLOBIN (HGB A1C): Hemoglobin A1C: 6.1 % — AB (ref 4.0–5.6)

## 2020-12-25 NOTE — Assessment & Plan Note (Signed)
Well controlled. Continue current regimen. Follow up in  6 mo  

## 2020-12-25 NOTE — Assessment & Plan Note (Signed)
Discussed not well designed clinical trial for prevagen to determine if truly effective. If not helping don't continue to purchase it.

## 2020-12-25 NOTE — Assessment & Plan Note (Signed)
Reviewed ingredients in supplement. Ok to take.  Also encouraged to continue to work on healthy choices and regular exercise.

## 2020-12-25 NOTE — Progress Notes (Signed)
Established Patient Office Visit  Subjective:  Patient ID: Zachary Miranda, male    DOB: 27-Nov-1950  Age: 70 y.o. MRN: [id number]  CC:  Chief Complaint  Patient presents with   Hypertension   ifg    HPI RAJVEER HANDLER presents for   Hypertension- Pt denies chest pain, SOB, dizziness, or heart palpitations.  Taking meds as directed w/o problems.  Denies medication side effects.    Impaired fasting glucose-no increased thirst or urination. No symptoms consistent with hypoglycemia.  Reports that he has been staying active.  No regular exercise routine is not been going to the gym lately.  Recently started a OTC weight loss supplement. He does feel that it helps. Started it 3 weeks ago.    Also wanted to ask about prevagen. Noticed his memory not as sharp as it used to be. + fam hx of Alzheimers.   Past Medical History:  Diagnosis Date   Allergy    Hyperlipidemia    Hypertension    Migraines    occular    Past Surgical History:  Procedure Laterality Date   LASIK     MANDIBLE SURGERY      Family History  Problem Relation Age of Onset   Cancer Sister    Parkinson's disease Sister    Cancer Father        melanoma, died age 65   Heart disease Father 59       MI   Heart attack Father    AAA (abdominal aortic aneurysm) Brother    Heart disease Brother    COPD Brother    Sleep apnea Brother    Heart disease Brother    Alzheimer's disease Brother        Lew Body Dementia, 62   Thyroid disease Mother 61       died 50    Social History   Socioeconomic History   Marital status: Married    Spouse name: Not on file   Number of children: Not on file   Years of education: Not on file   Highest education level: Not on file  Occupational History   Not on file  Tobacco Use   Smoking status: Never   Smokeless tobacco: Never  Substance and Sexual Activity   Alcohol use: Yes    Alcohol/week: 10.0 standard drinks    Types: 6 Standard drinks or  equivalent, 4 Cans of beer per week    Comment: States he never drinks more than 3 beers at a time.   Drug use: No   Sexual activity: Not on file  Other Topics Concern   Not on file  Social History Narrative   Not on file   Social Determinants of Health   Financial Resource Strain: Not on file  Food Insecurity: Not on file  Transportation Needs: Not on file  Physical Activity: Not on file  Stress: Not on file  Social Connections: Not on file  Intimate Partner Violence: Not on file    Outpatient Medications Prior to Visit  Medication Sig Dispense Refill   AMBULATORY NON FORMULARY MEDICATION Take 1 capsule by mouth daily. Medication Name: GOLO Release     cholecalciferol (VITAMIN D) 1000 UNITS tablet Take 1,000 Units by mouth daily.     co-enzyme Q-10 30 MG capsule Take 30 mg by mouth daily.     ezetimibe (ZETIA) 10 MG tablet TAKE 1 TABLET BY MOUTH  DAILY 90 tablet 3   fluticasone (FLONASE) 50 MCG/ACT nasal spray  One spray in each nostril twice a day, use left hand for right nostril, and right hand for left nostril. 48 g 3   Multiple Vitamins-Minerals (MULTIVITAMIN WITH MINERALS) tablet Take 1 tablet by mouth daily.     Omega-3 Fatty Acids (FISH OIL) 1000 MG CAPS Take by mouth.     simvastatin (ZOCOR) 40 MG tablet TAKE 1 TABLET BY MOUTH  DAILY 90 tablet 3   valsartan-hydrochlorothiazide (DIOVAN-HCT) 320-12.5 MG tablet TAKE 1 TABLET BY MOUTH  DAILY 90 tablet 3   No facility-administered medications prior to visit.    Allergies  Allergen Reactions   Atorvastatin     REACTION: myalgias   Prochlorperazine     Other reaction(s): Other (See Comments) Had reaction ~ > 40 yrs ago, muscle contractions   Rosuvastatin     REACTION: cause myalgias    ROS Review of Systems    Objective:    Physical Exam  BP 116/64   Pulse 66   Ht 5\' 10"  (1.778 m)   Wt 237 lb (107.5 kg)   SpO2 99%   BMI 34.01 kg/m  Wt Readings from Last 3 Encounters:  12/25/20 237 lb (107.5 kg)   06/05/20 238 lb (108 kg)  05/30/20 237 lb 12.8 oz (107.9 kg)     Health Maintenance Due  Topic Date Due   Zoster Vaccines- Shingrix (1 of 2) Never done   COVID-19 Vaccine (4 - Booster for Moderna series) 07/30/2020    There are no preventive care reminders to display for this patient.  Lab Results  Component Value Date   TSH 1.652 02/21/2011   No results found for: WBC, HGB, HCT, MCV, PLT Lab Results  Component Value Date   NA 142 06/05/2020   K 4.5 06/05/2020   CO2 29 06/05/2020   GLUCOSE 111 (H) 06/05/2020   BUN 16 06/05/2020   CREATININE 0.96 06/05/2020   BILITOT 0.8 04/05/2020   ALKPHOS 71 07/11/2016   AST 19 04/05/2020   ALT 18 04/05/2020   PROT 6.2 04/05/2020   ALBUMIN 4.3 07/11/2016   CALCIUM 9.5 06/05/2020   Lab Results  Component Value Date   CHOL 133 04/05/2020   Lab Results  Component Value Date   HDL 53 04/05/2020   Lab Results  Component Value Date   LDLCALC 62 04/05/2020   Lab Results  Component Value Date   TRIG 94 04/05/2020   Lab Results  Component Value Date   CHOLHDL 2.5 04/05/2020   Lab Results  Component Value Date   HGBA1C 6.1 (A) 12/25/2020      Assessment & Plan:   Problem List Items Addressed This Visit       Cardiovascular and Mediastinum   HYPERTENSION, BENIGN SYSTEMIC - Primary    Well controlled. Continue current regimen. Follow up in  6 mo        Relevant Orders   COMPLETE METABOLIC PANEL WITH GFR   Lipid panel   PSA (Completed)     Endocrine   IFG (impaired fasting glucose)    Well controlled. Continue current regimen. Follow up in  6 mo        Relevant Orders   COMPLETE METABOLIC PANEL WITH GFR   Lipid panel   PSA (Completed)   POCT glycosylated hemoglobin (Hb A1C) (Completed)     Other   Memory changes    Discussed not well designed clinical trial for prevagen to determine if truly effective. If not helping don't continue to purchase it.  BMI 34.0-34.9,adult    Reviewed ingredients  in supplement. Ok to take.  Also encouraged to continue to work on healthy choices and regular exercise.        Other Visit Diagnoses     Screening for prostate cancer       Relevant Orders   PSA (Completed)       No orders of the defined types were placed in this encounter.   Follow-up: Return in about 6 months (around 06/27/2021) for Hypertension, Pre-diabetes.    Beatrice Lecher, MD

## 2020-12-26 LAB — COMPLETE METABOLIC PANEL WITH GFR
AG Ratio: 2.2 (calc) (ref 1.0–2.5)
ALT: 17 U/L (ref 9–46)
AST: 20 U/L (ref 10–35)
Albumin: 4.3 g/dL (ref 3.6–5.1)
Alkaline phosphatase (APISO): 69 U/L (ref 35–144)
BUN: 20 mg/dL (ref 7–25)
CO2: 27 mmol/L (ref 20–32)
Calcium: 9.2 mg/dL (ref 8.6–10.3)
Chloride: 107 mmol/L (ref 98–110)
Creat: 0.93 mg/dL (ref 0.70–1.28)
Globulin: 2 g/dL (calc) (ref 1.9–3.7)
Glucose, Bld: 106 mg/dL — ABNORMAL HIGH (ref 65–99)
Potassium: 4.4 mmol/L (ref 3.5–5.3)
Sodium: 142 mmol/L (ref 135–146)
Total Bilirubin: 0.8 mg/dL (ref 0.2–1.2)
Total Protein: 6.3 g/dL (ref 6.1–8.1)
eGFR: 88 mL/min/{1.73_m2} (ref >=60)

## 2020-12-26 LAB — LIPID PANEL
Cholesterol: 128 mg/dL (ref <200)
HDL: 53 mg/dL (ref >=40)
LDL Cholesterol (Calc): 59 mg/dL (calc)
Non-HDL Cholesterol (Calc): 75 mg/dL (calc) (ref <130)
Total CHOL/HDL Ratio: 2.4 (calc) (ref <5.0)
Triglycerides: 76 mg/dL (ref <150)

## 2020-12-26 LAB — PSA: PSA: 1.35 ng/mL (ref <=4.00)

## 2020-12-27 ENCOUNTER — Telehealth: Payer: Self-pay | Admitting: Family Medicine

## 2020-12-27 NOTE — Chronic Care Management (AMB) (Signed)
  Chronic Care Management   Outreach Note  12/27/2020 Name: Zachary Miranda MRN: [id number] DOB: [birth year]  Referred by: Hali Marry, MD Reason for referral : No chief complaint on file.   An unsuccessful telephone outreach was attempted today. The patient was referred to the pharmacist for assistance with care management and care coordination.   Follow Up Plan:   Lauretta Grill Upstream Scheduler

## 2020-12-28 ENCOUNTER — Encounter: Payer: Self-pay | Admitting: *Deleted

## 2020-12-30 ENCOUNTER — Ambulatory Visit (INDEPENDENT_AMBULATORY_CARE_PROVIDER_SITE_OTHER): Payer: Medicare Other

## 2020-12-30 ENCOUNTER — Other Ambulatory Visit: Payer: Self-pay

## 2020-12-30 DIAGNOSIS — R918 Other nonspecific abnormal finding of lung field: Secondary | ICD-10-CM

## 2020-12-30 DIAGNOSIS — I700 Atherosclerosis of aorta: Secondary | ICD-10-CM | POA: Diagnosis not present

## 2020-12-30 DIAGNOSIS — R911 Solitary pulmonary nodule: Secondary | ICD-10-CM | POA: Diagnosis not present

## 2021-01-04 ENCOUNTER — Encounter: Payer: Self-pay | Admitting: *Deleted

## 2021-01-08 ENCOUNTER — Telehealth: Payer: Self-pay | Admitting: Family Medicine

## 2021-01-08 NOTE — Progress Notes (Signed)
  Chronic Care Management   Note  01/08/2021 Name: Zachary Miranda MRN: [id number] DOB: 1950-10-30  Zachary Miranda is a 70 y.o. year old male who is a primary care patient of Madilyn Fireman, Rene Kocher, MD. I reached out to Osborn Coho by phone today in response to a referral sent by Zachary Miranda's PCP, Hali Marry, MD.   Zachary Miranda was given information about Chronic Care Management services today including:  CCM service includes personalized support from designated clinical staff supervised by his physician, including individualized plan of care and coordination with other care providers 24/7 contact phone numbers for assistance for urgent and routine care needs. Service will only be billed when office clinical staff spend 20 minutes or more in a month to coordinate care. Only one practitioner may furnish and bill the service in a calendar month. The patient may stop CCM services at any time (effective at the end of the month) by phone call to the office staff.   Patient did not agree to enrollment in care management services and does not wish to consider at this time.  Follow up plan:   Lauretta Grill Upstream Scheduler

## 2021-01-24 ENCOUNTER — Other Ambulatory Visit: Payer: Self-pay | Admitting: Family Medicine

## 2021-01-24 DIAGNOSIS — E785 Hyperlipidemia, unspecified: Secondary | ICD-10-CM

## 2021-02-22 ENCOUNTER — Telehealth: Payer: Self-pay | Admitting: Family Medicine

## 2021-02-22 NOTE — Telephone Encounter (Signed)
Spoke with patient to schedule Medicare Annual Wellness Visit (AWV) either virtually or in office.  Patient declined not interested   Last AWV 07/04/15

## 2021-05-02 ENCOUNTER — Other Ambulatory Visit: Payer: Self-pay | Admitting: Family Medicine

## 2021-05-02 DIAGNOSIS — I10 Essential (primary) hypertension: Secondary | ICD-10-CM

## 2021-06-04 DIAGNOSIS — H00025 Hordeolum internum left lower eyelid: Secondary | ICD-10-CM | POA: Diagnosis not present

## 2021-06-27 ENCOUNTER — Other Ambulatory Visit: Payer: Self-pay

## 2021-06-27 ENCOUNTER — Ambulatory Visit (INDEPENDENT_AMBULATORY_CARE_PROVIDER_SITE_OTHER): Payer: Medicare Other | Admitting: Family Medicine

## 2021-06-27 ENCOUNTER — Encounter: Payer: Self-pay | Admitting: Family Medicine

## 2021-06-27 VITALS — BP 137/81 | HR 77 | Resp 18 | Ht 70.0 in | Wt 230.0 lb

## 2021-06-27 DIAGNOSIS — I10 Essential (primary) hypertension: Secondary | ICD-10-CM | POA: Diagnosis not present

## 2021-06-27 DIAGNOSIS — R0982 Postnasal drip: Secondary | ICD-10-CM

## 2021-06-27 DIAGNOSIS — D229 Melanocytic nevi, unspecified: Secondary | ICD-10-CM

## 2021-06-27 DIAGNOSIS — R7301 Impaired fasting glucose: Secondary | ICD-10-CM | POA: Diagnosis not present

## 2021-06-27 LAB — POCT GLYCOSYLATED HEMOGLOBIN (HGB A1C): Hemoglobin A1C: 6 % — AB (ref 4.0–5.6)

## 2021-06-27 LAB — BASIC METABOLIC PANEL WITH GFR
BUN: 14 mg/dL (ref 7–25)
CO2: 32 mmol/L (ref 20–32)
Calcium: 9.4 mg/dL (ref 8.6–10.3)
Chloride: 105 mmol/L (ref 98–110)
Creat: 0.9 mg/dL (ref 0.70–1.28)
Glucose, Bld: 120 mg/dL — ABNORMAL HIGH (ref 65–99)
Potassium: 4.6 mmol/L (ref 3.5–5.3)
Sodium: 141 mmol/L (ref 135–146)
eGFR: 91 mL/min/{1.73_m2} (ref >=60)

## 2021-06-27 NOTE — Assessment & Plan Note (Signed)
Well controlled. Continue current regimen. Follow up in  6 mo  

## 2021-06-27 NOTE — Progress Notes (Signed)
Established Patient Office Visit  Subjective:  Patient ID: Zachary Miranda, male    DOB: 05/04/1951  Age: 71 y.o. MRN: [id number]  CC:  Chief Complaint  Patient presents with   Impaired Fasting Glucose     Follow up    Nevus    Right side of mouth for several years. Painful at times when shaving     HPI NATANIEL GASPER presents for   Hypertension- Pt denies chest pain, SOB, dizziness, or heart palpitations.  Taking meds as directed w/o problems.  Denies medication side effects.  Reports home blood pressures have looked really great.  He says today pressures a little bit high for him but he has been taking cold medicine.  Impaired fasting glucose-no increased thirst or urination. No symptoms consistent with hypoglycemia.  He did have COVID earlier this year.  He feels like overall he is a lot better but still has a little bit of runny drainage.  The cough is much better.  Still some occasional sneezing.  He is been using some Mucinex DM.  He did take that last night.  But he says initially the mucus and drainage has been mostly clear.  He also unfortunately had a stomach bug with vomiting and diarrhea around Christmas.  He is also been on Golo and feels like it has been helpful to help with satiety between meals.  He says he is lost about 10 to 12 pounds on his home scale he feels like it actually has been really helpful.  He also complains of a mole on his right upper lip that has been there for years.  He says it does get irritated sometimes when he is shaving and occasionally will pick at it a little bit.  Past Medical History:  Diagnosis Date   Allergy    Hyperlipidemia    Hypertension    Migraines    occular    Past Surgical History:  Procedure Laterality Date   LASIK     MANDIBLE SURGERY      Family History  Problem Relation Age of Onset   Cancer Sister    Parkinson's disease Sister    Cancer Father        melanoma, died age 63   Heart disease Father  63       MI   Heart attack Father    AAA (abdominal aortic aneurysm) Brother    Heart disease Brother    COPD Brother    Sleep apnea Brother    Heart disease Brother    Alzheimer's disease Brother        Lew Body Dementia, 65   Thyroid disease Mother 43       died 4    Social History   Socioeconomic History   Marital status: Married    Spouse name: Not on file   Number of children: Not on file   Years of education: Not on file   Highest education level: Not on file  Occupational History   Not on file  Tobacco Use   Smoking status: Never   Smokeless tobacco: Never  Substance and Sexual Activity   Alcohol use: Yes    Alcohol/week: 10.0 standard drinks    Types: 6 Standard drinks or equivalent, 4 Cans of beer per week    Comment: States he never drinks more than 3 beers at a time.   Drug use: No   Sexual activity: Not on file  Other Topics Concern   Not on  file  Social History Narrative   Not on file   Social Determinants of Health   Financial Resource Strain: Not on file  Food Insecurity: Not on file  Transportation Needs: Not on file  Physical Activity: Not on file  Stress: Not on file  Social Connections: Not on file  Intimate Partner Violence: Not on file    Outpatient Medications Prior to Visit  Medication Sig Dispense Refill   AMBULATORY NON FORMULARY MEDICATION Take 1 capsule by mouth daily. Medication Name: GOLO Release     cholecalciferol (VITAMIN D) 1000 UNITS tablet Take 1,000 Units by mouth daily.     co-enzyme Q-10 30 MG capsule Take 30 mg by mouth daily.     ezetimibe (ZETIA) 10 MG tablet TAKE 1 TABLET BY MOUTH  DAILY 90 tablet 3   fluticasone (FLONASE) 50 MCG/ACT nasal spray One spray in each nostril twice a day, use left hand for right nostril, and right hand for left nostril. 48 g 3   Multiple Vitamins-Minerals (MULTIVITAMIN WITH MINERALS) tablet Take 1 tablet by mouth daily.     Omega-3 Fatty Acids (FISH OIL) 1000 MG CAPS Take by mouth.      simvastatin (ZOCOR) 40 MG tablet TAKE 1 TABLET BY MOUTH  DAILY 90 tablet 3   valsartan-hydrochlorothiazide (DIOVAN-HCT) 320-12.5 MG tablet TAKE 1 TABLET BY MOUTH  DAILY 90 tablet 3   No facility-administered medications prior to visit.    Allergies  Allergen Reactions   Atorvastatin     REACTION: myalgias   Prochlorperazine     Other reaction(s): Other (See Comments) Had reaction ~ > 40 yrs ago, muscle contractions   Rosuvastatin     REACTION: cause myalgias    ROS Review of Systems    Objective:    Physical Exam Constitutional:      Appearance: Normal appearance. He is well-developed.  HENT:     Head: Normocephalic and atraumatic.  Cardiovascular:     Rate and Rhythm: Normal rate and regular rhythm.     Heart sounds: Normal heart sounds.  Pulmonary:     Effort: Pulmonary effort is normal.     Breath sounds: Normal breath sounds.  Skin:    General: Skin is warm and dry.  Neurological:     Mental Status: He is alert and oriented to person, place, and time. Mental status is at baseline.  Psychiatric:        Behavior: Behavior normal.   BP 137/81 (BP Location: Left Arm)    Pulse 77    Resp 18    Ht 5' 10"  (1.778 m)    Wt 230 lb (104.3 kg)    SpO2 94%    BMI 33.00 kg/m  Wt Readings from Last 3 Encounters:  06/27/21 230 lb (104.3 kg)  12/25/20 237 lb (107.5 kg)  06/05/20 238 lb (108 kg)     There are no preventive care reminders to display for this patient.   There are no preventive care reminders to display for this patient.  Lab Results  Component Value Date   TSH 1.652 02/21/2011   No results found for: WBC, HGB, HCT, MCV, PLT Lab Results  Component Value Date   NA 142 12/25/2020   K 4.4 12/25/2020   CO2 27 12/25/2020   GLUCOSE 106 (H) 12/25/2020   BUN 20 12/25/2020   CREATININE 0.93 12/25/2020   BILITOT 0.8 12/25/2020   ALKPHOS 71 07/11/2016   AST 20 12/25/2020   ALT 17 12/25/2020   PROT 6.3  12/25/2020   ALBUMIN 4.3 07/11/2016   CALCIUM 9.2  12/25/2020   EGFR 88 12/25/2020   Lab Results  Component Value Date   CHOL 128 12/25/2020   Lab Results  Component Value Date   HDL 53 12/25/2020   Lab Results  Component Value Date   LDLCALC 59 12/25/2020   Lab Results  Component Value Date   TRIG 76 12/25/2020   Lab Results  Component Value Date   CHOLHDL 2.4 12/25/2020   Lab Results  Component Value Date   HGBA1C 6.1 (A) 12/25/2020      Assessment & Plan:   Problem List Items Addressed This Visit       Cardiovascular and Mediastinum   HYPERTENSION, BENIGN SYSTEMIC - Primary    Well controlled. Continue current regimen. Follow up in  6 mo       Relevant Orders   POCT glycosylated hemoglobin (Hb C1U)   BASIC METABOLIC PANEL WITH GFR     Endocrine   IFG (impaired fasting glucose)    Well controlled. Continue current regimen. Follow up in  6 mo.        Relevant Orders   POCT glycosylated hemoglobin (Hb L8G)   BASIC METABOLIC PANEL WITH GFR   Other Visit Diagnoses     Nevus       Relevant Orders   Ambulatory referral to Dermatology   Post-nasal drip           Nevus - He also has what looks like most likely benign lesion on his right upper lip but I do think it gets traumatized frequently from shaving etc.  I did recommend removal will refer to dermatology since it is on the face.  Postnasal drip/drainage-likely secondary to recent illness.  Nothing on exam or history to suspect bacterial infection at this point.  In fact he actually had a round of oral antibiotics for a stye.  No orders of the defined types were placed in this encounter.   Follow-up: Return in about 6 months (around 12/25/2021) for Hypertension, Pre-diabetes.    Beatrice Lecher, MD

## 2021-06-28 NOTE — Progress Notes (Signed)
Your lab work is within acceptable range and there are no concerning findings.   ?

## 2021-07-19 ENCOUNTER — Telehealth: Payer: Self-pay

## 2021-07-19 MED ORDER — DOXYCYCLINE HYCLATE 100 MG PO TABS: 100.0000 mg | ORAL_TABLET | Freq: Two times a day (BID) | ORAL | 0 refills | Status: DC

## 2021-07-19 NOTE — Telephone Encounter (Signed)
Meds ordered this encounter  Medications   doxycycline (VIBRA-TABS) 100 MG tablet    Sig: Take 1 tablet (100 mg total) by mouth 2 (two) times daily.    Dispense:  20 tablet    Refill:  0

## 2021-07-19 NOTE — Telephone Encounter (Signed)
Patient left a vm msg stating that he continues to have the cough and congestion. He has requested if provider can send in a rx to the pharmacy for the cough. Patient was last seen on 06/27/21. Please advise, thanks.

## 2021-07-20 NOTE — Telephone Encounter (Signed)
Left message advising of recommendations.  

## 2021-07-26 DIAGNOSIS — J320 Chronic maxillary sinusitis: Secondary | ICD-10-CM | POA: Diagnosis not present

## 2021-07-27 ENCOUNTER — Other Ambulatory Visit: Payer: Self-pay | Admitting: Cardiology

## 2021-07-27 DIAGNOSIS — E7800 Pure hypercholesterolemia, unspecified: Secondary | ICD-10-CM

## 2021-08-08 ENCOUNTER — Ambulatory Visit: Payer: Medicare Other | Admitting: Family Medicine

## 2021-10-17 ENCOUNTER — Other Ambulatory Visit: Payer: Self-pay | Admitting: Cardiology

## 2021-10-17 DIAGNOSIS — E7800 Pure hypercholesterolemia, unspecified: Secondary | ICD-10-CM

## 2021-11-26 ENCOUNTER — Telehealth (HOSPITAL_BASED_OUTPATIENT_CLINIC_OR_DEPARTMENT_OTHER): Payer: Self-pay | Admitting: Cardiology

## 2021-11-26 ENCOUNTER — Other Ambulatory Visit (HOSPITAL_BASED_OUTPATIENT_CLINIC_OR_DEPARTMENT_OTHER): Payer: Self-pay | Admitting: *Deleted

## 2021-11-26 DIAGNOSIS — R918 Other nonspecific abnormal finding of lung field: Secondary | ICD-10-CM

## 2021-11-26 NOTE — Progress Notes (Signed)
cta

## 2021-11-26 NOTE — Telephone Encounter (Signed)
Spoke with patient regarding the Monday 12/10/21 10:00 am at [facility] time is 9:45 am ground floor [facility] for check in ---liquids only 4 hours prior to study---patient to have labs 11/28/21 or 11/29/21.  He voiced his understanding

## 2021-11-29 ENCOUNTER — Other Ambulatory Visit: Payer: Self-pay | Admitting: *Deleted

## 2021-11-29 DIAGNOSIS — Z01812 Encounter for preprocedural laboratory examination: Secondary | ICD-10-CM

## 2021-11-29 NOTE — Telephone Encounter (Signed)
Patient is following up. He states he went to [facility] in [place] this morning, but they do not have an order for lab work. Patient provided a fax # for [facility], [phone].

## 2021-11-29 NOTE — Telephone Encounter (Signed)
Lm to call back re message lab faxed to requested number Also reentered order under [facility]

## 2021-12-03 ENCOUNTER — Other Ambulatory Visit: Payer: Medicare Other

## 2021-12-03 DIAGNOSIS — R918 Other nonspecific abnormal finding of lung field: Secondary | ICD-10-CM | POA: Diagnosis not present

## 2021-12-05 ENCOUNTER — Other Ambulatory Visit: Payer: Self-pay | Admitting: Family Medicine

## 2021-12-05 DIAGNOSIS — E785 Hyperlipidemia, unspecified: Secondary | ICD-10-CM

## 2021-12-06 ENCOUNTER — Encounter: Payer: Self-pay | Admitting: *Deleted

## 2021-12-06 DIAGNOSIS — Z1211 Encounter for screening for malignant neoplasm of colon: Secondary | ICD-10-CM | POA: Diagnosis not present

## 2021-12-06 DIAGNOSIS — K5730 Diverticulosis of large intestine without perforation or abscess without bleeding: Secondary | ICD-10-CM | POA: Diagnosis not present

## 2021-12-06 DIAGNOSIS — Z09 Encounter for follow-up examination after completed treatment for conditions other than malignant neoplasm: Secondary | ICD-10-CM | POA: Diagnosis not present

## 2021-12-06 DIAGNOSIS — Z86010 Personal history of colonic polyps: Secondary | ICD-10-CM | POA: Diagnosis not present

## 2021-12-10 ENCOUNTER — Ambulatory Visit: Payer: Medicare Other

## 2021-12-10 MED ORDER — IOHEXOL 350 MG/ML SOLN
80.0000 mL | Freq: Once | INTRAVENOUS | Status: AC | PRN
  Administered 2021-12-10: 80 mL via INTRAVENOUS

## 2021-12-17 ENCOUNTER — Ambulatory Visit (INDEPENDENT_AMBULATORY_CARE_PROVIDER_SITE_OTHER): Payer: Medicare Other

## 2021-12-17 DIAGNOSIS — K8020 Calculus of gallbladder without cholecystitis without obstruction: Secondary | ICD-10-CM | POA: Diagnosis not present

## 2021-12-17 DIAGNOSIS — R918 Other nonspecific abnormal finding of lung field: Secondary | ICD-10-CM

## 2021-12-17 MED ORDER — IOHEXOL 350 MG/ML SOLN
100.0000 mL | Freq: Once | INTRAVENOUS | Status: AC | PRN
  Administered 2021-12-17: 100 mL via INTRAVENOUS

## 2021-12-21 ENCOUNTER — Encounter: Payer: Self-pay | Admitting: *Deleted

## 2021-12-26 ENCOUNTER — Ambulatory Visit (INDEPENDENT_AMBULATORY_CARE_PROVIDER_SITE_OTHER): Payer: Medicare Other | Admitting: Family Medicine

## 2021-12-26 ENCOUNTER — Encounter: Payer: Self-pay | Admitting: Family Medicine

## 2021-12-26 VITALS — BP 117/67 | HR 72 | Ht 70.0 in | Wt 230.0 lb

## 2021-12-26 DIAGNOSIS — I10 Essential (primary) hypertension: Secondary | ICD-10-CM

## 2021-12-26 DIAGNOSIS — E7800 Pure hypercholesterolemia, unspecified: Secondary | ICD-10-CM

## 2021-12-26 DIAGNOSIS — Z125 Encounter for screening for malignant neoplasm of prostate: Secondary | ICD-10-CM | POA: Diagnosis not present

## 2021-12-26 DIAGNOSIS — R7301 Impaired fasting glucose: Secondary | ICD-10-CM | POA: Diagnosis not present

## 2021-12-26 LAB — POCT GLYCOSYLATED HEMOGLOBIN (HGB A1C): Hemoglobin A1C: 5.8 % — AB (ref 4.0–5.6)

## 2021-12-26 MED ORDER — EZETIMIBE 10 MG PO TABS: 10.0000 mg | ORAL_TABLET | Freq: Every day | ORAL | 3 refills | Status: DC

## 2021-12-26 NOTE — Progress Notes (Signed)
Established Patient Office Visit  Subjective   Patient ID: Zachary Miranda, male    DOB: 04/19/1951  Age: 71 y.o. MRN: [id number]  Chief Complaint  Patient presents with   Hypertension   ifg    HPI Hypertension- Pt denies chest pain, SOB, dizziness, or heart palpitations.  Taking meds as directed w/o problems.  Denies medication side effects.  He walks his dog but does not have a specific exercise routine.  Impaired fasting glucose-no increased thirst or urination. No symptoms consistent with hypoglycemia.  Recently had CT of chest to follow ascending thoracic aortic dilatation.  Measuring 3.9 cm.  Stable.  Had a BMP done around that time.  Also dropped off some home blood pressures for his wife that she wanted [place] to review.  Had his colonoscopy. Hx of polyps.       ROS    Objective:     BP 117/67   Pulse 72   Ht '5\' 10"'$  (1.778 m)   Wt 230 lb (104.3 kg)   SpO2 96%   BMI 33.00 kg/m     Physical Exam Constitutional:      Appearance: He is well-developed.  HENT:     Head: Normocephalic and atraumatic.  Cardiovascular:     Rate and Rhythm: Normal rate and regular rhythm.     Heart sounds: Normal heart sounds.  Pulmonary:     Effort: Pulmonary effort is normal.     Breath sounds: Normal breath sounds.  Skin:    General: Skin is warm and dry.  Neurological:     Mental Status: He is alert and oriented to person, place, and time.  Psychiatric:        Behavior: Behavior normal.      Results for orders placed or performed in visit on 12/26/21  POCT glycosylated hemoglobin (Hb A1C)  Result Value Ref Range   Hemoglobin A1C 5.8 (A) 4.0 - 5.6 %   HbA1c POC (<> result, manual entry)     HbA1c, POC (prediabetic range)     HbA1c, POC (controlled diabetic range)         The ASCVD Risk score (Arnett DK, et al., 2019) failed to calculate for the following reasons:   The valid total cholesterol range is 130 to 320 mg/dL    Assessment & Plan:   Problem  List Items Addressed This Visit       Cardiovascular and Mediastinum   HYPERTENSION, BENIGN SYSTEMIC    Well controlled. Continue current regimen. Follow up in  6 mo       Relevant Medications   ezetimibe (ZETIA) 10 MG tablet   Other Relevant Orders   PSA   Lipid panel   CBC   Hepatic function panel     Endocrine   IFG (impaired fasting glucose) - Primary    Well controlled. Continue current regimen. Follow up in  40mo      Relevant Orders   POCT glycosylated hemoglobin (Hb A1C) (Completed)   PSA   Lipid panel   CBC   Hepatic function panel     Other   Hyperlipidemia    Due for updated labs      Relevant Medications   ezetimibe (ZETIA) 10 MG tablet   Other Relevant Orders   Hepatic function panel   Other Visit Diagnoses     Screening for prostate cancer       Relevant Orders   PSA       Return in about 6  months (around 06/28/2022).    Beatrice Lecher, MD

## 2021-12-26 NOTE — Assessment & Plan Note (Signed)
Due for updated labs

## 2021-12-26 NOTE — Assessment & Plan Note (Signed)
Well controlled. Continue current regimen. Follow up in  6 mo  

## 2022-01-01 DIAGNOSIS — I10 Essential (primary) hypertension: Secondary | ICD-10-CM | POA: Diagnosis not present

## 2022-01-01 DIAGNOSIS — R7301 Impaired fasting glucose: Secondary | ICD-10-CM | POA: Diagnosis not present

## 2022-01-01 DIAGNOSIS — E7800 Pure hypercholesterolemia, unspecified: Secondary | ICD-10-CM | POA: Diagnosis not present

## 2022-01-02 ENCOUNTER — Encounter: Payer: Self-pay | Admitting: *Deleted

## 2022-01-02 LAB — CBC
HCT: 43.3 % (ref 38.5–50.0)
Hemoglobin: 14.4 g/dL (ref 13.2–17.1)
MCH: 31.2 pg (ref 27.0–33.0)
MCHC: 33.3 g/dL (ref 32.0–36.0)
MCV: 93.9 fL (ref 80.0–100.0)
MPV: 10.9 fL (ref 7.5–12.5)
Platelets: 188 10*3/uL (ref 140–400)
RBC: 4.61 10*6/uL (ref 4.20–5.80)
RDW: 12.1 % (ref 11.0–15.0)
WBC: 7.1 10*3/uL (ref 3.8–10.8)

## 2022-01-02 LAB — LIPID PANEL
Cholesterol: 129 mg/dL (ref <200)
HDL: 53 mg/dL (ref >=40)
LDL Cholesterol (Calc): 59 mg/dL (calc)
Non-HDL Cholesterol (Calc): 76 mg/dL (calc) (ref <130)
Total CHOL/HDL Ratio: 2.4 (calc) (ref <5.0)
Triglycerides: 85 mg/dL (ref <150)

## 2022-01-02 LAB — HEPATIC FUNCTION PANEL
AG Ratio: 2.2 (calc) (ref 1.0–2.5)
ALT: 13 U/L (ref 9–46)
AST: 17 U/L (ref 10–35)
Albumin: 4.2 g/dL (ref 3.6–5.1)
Alkaline phosphatase (APISO): 74 U/L (ref 35–144)
Bilirubin, Direct: 0.1 mg/dL (ref 0.0–0.2)
Globulin: 1.9 g/dL (calc) (ref 1.9–3.7)
Indirect Bilirubin: 0.5 mg/dL (calc) (ref 0.2–1.2)
Total Bilirubin: 0.6 mg/dL (ref 0.2–1.2)
Total Protein: 6.1 g/dL (ref 6.1–8.1)

## 2022-01-02 LAB — PSA: PSA: 1.41 ng/mL (ref <=4.00)

## 2022-01-02 NOTE — Progress Notes (Signed)
HI [name], prostate test is normal. Blood count and cholesterol is OK. Liver function if normal.

## 2022-02-06 ENCOUNTER — Encounter: Payer: Self-pay | Admitting: General Practice

## 2022-03-27 DIAGNOSIS — J209 Acute bronchitis, unspecified: Secondary | ICD-10-CM | POA: Diagnosis not present

## 2022-03-27 DIAGNOSIS — R051 Acute cough: Secondary | ICD-10-CM | POA: Diagnosis not present

## 2022-03-27 DIAGNOSIS — R0981 Nasal congestion: Secondary | ICD-10-CM | POA: Diagnosis not present

## 2022-04-25 DIAGNOSIS — R059 Cough, unspecified: Secondary | ICD-10-CM | POA: Diagnosis not present

## 2022-04-25 DIAGNOSIS — R0989 Other specified symptoms and signs involving the circulatory and respiratory systems: Secondary | ICD-10-CM | POA: Diagnosis not present

## 2022-04-25 DIAGNOSIS — J208 Acute bronchitis due to other specified organisms: Secondary | ICD-10-CM | POA: Diagnosis not present

## 2022-04-25 DIAGNOSIS — B9689 Other specified bacterial agents as the cause of diseases classified elsewhere: Secondary | ICD-10-CM | POA: Diagnosis not present

## 2022-05-07 ENCOUNTER — Ambulatory Visit (INDEPENDENT_AMBULATORY_CARE_PROVIDER_SITE_OTHER): Payer: Medicare Other

## 2022-05-07 ENCOUNTER — Ambulatory Visit (INDEPENDENT_AMBULATORY_CARE_PROVIDER_SITE_OTHER): Payer: Medicare Other | Admitting: Family Medicine

## 2022-05-07 ENCOUNTER — Encounter: Payer: Self-pay | Admitting: Family Medicine

## 2022-05-07 VITALS — BP 129/78 | HR 66 | Ht 70.0 in | Wt 230.0 lb

## 2022-05-07 DIAGNOSIS — R053 Chronic cough: Secondary | ICD-10-CM | POA: Diagnosis not present

## 2022-05-07 DIAGNOSIS — J40 Bronchitis, not specified as acute or chronic: Secondary | ICD-10-CM | POA: Diagnosis not present

## 2022-05-07 MED ORDER — HYDROCOD POLI-CHLORPHE POLI ER 10-8 MG/5ML PO SUER: 5.0000 mL | Freq: Two times a day (BID) | ORAL | 0 refills | Status: DC | PRN

## 2022-05-07 MED ORDER — ALBUTEROL SULFATE HFA 108 (90 BASE) MCG/ACT IN AERS: 2.0000 | INHALATION_SPRAY | Freq: Four times a day (QID) | RESPIRATORY_TRACT | 0 refills | Status: DC | PRN

## 2022-05-07 NOTE — Assessment & Plan Note (Signed)
-   unfortunately without relief from doxycycline and length of cough I believe this is mainly viral in nature - will go ahead and treat with albuterol inhaler since pt had tight, wheezing like cough on exam - have also given tussinex for cough

## 2022-05-07 NOTE — Assessment & Plan Note (Signed)
-   since cough has been chronic will go ahead and get cxr to rule out pna - cough sounded tight in exam room have gone ahead and prescribed albuterol

## 2022-05-07 NOTE — Progress Notes (Signed)
Acute Office Visit  Subjective:     Patient ID: Zachary Miranda, male    DOB: 11-Oct-1950, 71 y.o.   MRN: [id number]  Chief Complaint  Patient presents with   Cough    HPI Patient is in today for chronic cough for three weeks. He was seen at urgent care two weeks ago and diagnosed with bacterial bronchitis and given prednisone, tesslon perles, and doxycycline. He has tried mucinex that has not helped. He finishes the doxycycline antibiotic and says it didn't help at all.   Review of Systems  Constitutional:  Negative for chills and fever.  Respiratory:  Negative for cough and shortness of breath.   Cardiovascular:  Negative for chest pain.  Neurological:  Negative for headaches.        Objective:    BP 129/78   Pulse 66   Ht '5\' 10"'$  (1.778 m)   Wt 230 lb (104.3 kg)   SpO2 100%   BMI 33.00 kg/m    Physical Exam Vitals and nursing note reviewed.  Constitutional:      General: He is not in acute distress.    Appearance: Normal appearance.  HENT:     Head: Normocephalic and atraumatic.     Right Ear: External ear normal.     Left Ear: External ear normal.     Nose: Nose normal.  Eyes:     Conjunctiva/sclera: Conjunctivae normal.  Cardiovascular:     Rate and Rhythm: Normal rate and regular rhythm.  Pulmonary:     Effort: Pulmonary effort is normal.     Breath sounds: Normal breath sounds. No wheezing.  Neurological:     General: No focal deficit present.     Mental Status: He is alert and oriented to person, place, and time.  Psychiatric:        Mood and Affect: Mood normal.        Behavior: Behavior normal.        Thought Content: Thought content normal.        Judgment: Judgment normal.     No results found for any visits on 05/07/22.      Assessment & Plan:   Problem List Items Addressed This Visit       Respiratory   Bronchitis - Primary    - unfortunately without relief from doxycycline and length of cough I believe this is mainly viral  in nature - will go ahead and treat with albuterol inhaler since pt had tight, wheezing like cough on exam - have also given tussinex for cough      Relevant Medications   albuterol (VENTOLIN HFA) 108 (90 Base) MCG/ACT inhaler   chlorpheniramine-HYDROcodone (TUSSIONEX) 10-8 MG/5ML     Other   Chronic cough    - since cough has been chronic will go ahead and get cxr to rule out pna - cough sounded tight in exam room have gone ahead and prescribed albuterol       Relevant Orders   DG Chest 2 View    Meds ordered this encounter  Medications   albuterol (VENTOLIN HFA) 108 (90 Base) MCG/ACT inhaler    Sig: Inhale 2 puffs into the lungs every 6 (six) hours as needed for wheezing or shortness of breath.    Dispense:  8 g    Refill:  0   chlorpheniramine-HYDROcodone (TUSSIONEX) 10-8 MG/5ML    Sig: Take 5 mLs by mouth every 12 (twelve) hours as needed for cough (cough, will cause drowsiness.).  Dispense:  120 mL    Refill:  0    Return if symptoms worsen or fail to improve.  Owens Loffler, DO

## 2022-05-08 ENCOUNTER — Ambulatory Visit: Payer: Medicare Other | Admitting: Physician Assistant

## 2022-05-13 ENCOUNTER — Ambulatory Visit (INDEPENDENT_AMBULATORY_CARE_PROVIDER_SITE_OTHER): Payer: Medicare Other | Admitting: Family Medicine

## 2022-05-13 DIAGNOSIS — Z0000 Encounter for general adult medical examination without abnormal findings: Secondary | ICD-10-CM | POA: Diagnosis not present

## 2022-05-13 NOTE — Progress Notes (Signed)
MEDICARE ANNUAL WELLNESS VISIT  05/13/2022  Telephone Visit Disclaimer This Medicare AWV was conducted by telephone due to national recommendations for restrictions regarding the COVID-19 Pandemic (e.g. social distancing).  I verified, using two identifiers, that I am speaking with Zachary Miranda or their authorized healthcare agent. I discussed the limitations, risks, security, and privacy concerns of performing an evaluation and management service by telephone and the potential availability of an in-person appointment in the future. The patient expressed understanding and agreed to proceed.  Location of Patient: [facility] [place] [facility] (nurse):  in the office.  Subjective:    Zachary Miranda is a 71 y.o. male patient of Metheney, Rene Kocher, MD who had a Medicare Annual Wellness Visit today via telephone. Tal is Working full time and lives with their spouse. he does not have children. he reports that he is socially active and does interact with friends/family regularly. he is minimally physically active and enjoys work.  Patient Care Team: Hali Marry, MD as PCP - General Stanford Breed Denice Bors, MD as PCP - Cardiology (Cardiology)     05/13/2022    9:37 AM 07/11/2016    9:23 AM 12/23/2014    8:29 AM  Advanced Directives  Does Patient Have a Medical Advance Directive? Yes Yes No  Type of Advance Directive Living will Living will   Does patient want to make changes to medical advance directive? No - Patient declined No - Patient declined   Copy of [name] in Chart?  No - copy requested   Would patient like information on creating a medical advance directive?   Yes - Educational materials given    Hospital Utilization Over the Past 12 Months: # of hospitalizations or ER visits: 0 # of surgeries: 0  Review of Systems    Patient reports that his overall health is unchanged compared to last year.  History obtained from chart review and  the patient  Patient Reported Readings (BP, Pulse, CBG, Weight, etc) none  Pain Assessment Pain : No/denies pain     Current Medications & Allergies (verified) Allergies as of 05/13/2022       Reactions   Atorvastatin    REACTION: myalgias   Prochlorperazine    Other reaction(s): Other (See Comments) Had reaction ~ > 40 yrs ago, muscle contractions   Rosuvastatin    REACTION: cause myalgias        Medication List        Accurate as of May 13, 2022  9:48 AM. If you have any questions, ask your nurse or doctor.          albuterol 108 (90 Base) MCG/ACT inhaler Commonly known as: VENTOLIN HFA Inhale 2 puffs into the lungs every 6 (six) hours as needed for wheezing or shortness of breath.   AMBULATORY NON FORMULARY MEDICATION Take 1 capsule by mouth daily. Medication Name: GOLO Release   benzonatate 200 MG capsule Commonly known as: TESSALON Take 200 mg by mouth 3 (three) times daily as needed.   chlorpheniramine-HYDROcodone 10-8 MG/5ML Commonly known as: TUSSIONEX Take 5 mLs by mouth every 12 (twelve) hours as needed for cough (cough, will cause drowsiness.).   cholecalciferol 1000 units tablet Commonly known as: VITAMIN D Take 1,000 Units by mouth daily.   co-enzyme Q-10 30 MG capsule Take 30 mg by mouth daily.   ezetimibe 10 MG tablet Commonly known as: ZETIA Take 1 tablet (10 mg total) by mouth daily.   Fish Oil 1000  MG Caps Take by mouth.   fluticasone 50 MCG/ACT nasal spray Commonly known as: Flonase One spray in each nostril twice a day, use left hand for right nostril, and right hand for left nostril.   multivitamin with minerals tablet Take 1 tablet by mouth daily.   simvastatin 40 MG tablet Commonly known as: ZOCOR TAKE 1 TABLET BY MOUTH  DAILY   valsartan-hydrochlorothiazide 320-12.5 MG tablet Commonly known as: DIOVAN-HCT TAKE 1 TABLET BY MOUTH  DAILY        History (reviewed): Past Medical History:  Diagnosis Date    Allergy    Hyperlipidemia    Hypertension    Migraines    occular   Past Surgical History:  Procedure Laterality Date   LASIK     MANDIBLE SURGERY     Family History  Problem Relation Age of Onset   Cancer Sister    Parkinson's disease Sister    Cancer Father        melanoma, died age 25   Heart disease Father 2       MI   Heart attack Father    AAA (abdominal aortic aneurysm) Brother    Heart disease Brother    COPD Brother    Sleep apnea Brother    Heart disease Brother    Alzheimer's disease Brother        Lew Body Dementia, 13   Thyroid disease Mother 48       died 16   Social History   Socioeconomic History   Marital status: Married    Spouse name: Johnsie Kindred.   Number of children: 0   Years of education: 14   Highest education level: Associate degree: academic program  Occupational History   Occupation: Delivery/part-time    Comment: Full-time  Tobacco Use   Smoking status: Never   Smokeless tobacco: Never  Vaping Use   Vaping Use: Never used  Substance and Sexual Activity   Alcohol use: Yes    Alcohol/week: 7.0 standard drinks of alcohol    Types: 7 Cans of beer per week   Drug use: No   Sexual activity: Not on file  Other Topics Concern   Not on file  Social History Narrative   Lives with his wife. He enjoys working.   Social Determinants of Health   Financial Resource Strain: Low Risk  (05/13/2022)   Overall Financial Resource Strain (CARDIA)    Difficulty of Paying Living Expenses: Not hard at all  Food Insecurity: No Food Insecurity (05/13/2022)   Hunger Vital Sign    Worried About Running Out of Food in the Last Year: Never true    Ran Out of Food in the Last Year: Never true  Transportation Needs: No Transportation Needs (05/13/2022)   PRAPARE - Hydrologist (Medical): No    Lack of Transportation (Non-Medical): No  Physical Activity: Sufficiently Active (05/13/2022)   Exercise Vital Sign    Days of  Exercise per Week: 4 days    Minutes of Exercise per Session: 50 min  Stress: No Stress Concern Present (05/13/2022)   [place]    Feeling of Stress : Not at all  Social Connections: Moderately Isolated (05/13/2022)   Social Connection and Isolation Panel [NHANES]    Frequency of Communication with Friends and Family: Twice a week    Frequency of Social Gatherings with Friends and Family: More than three times a week  Attends Religious Services: Never    Active Member of Clubs or Organizations: No    Attends Archivist Meetings: Never    Marital Status: Married    Activities of Daily Living    05/13/2022    9:39 AM  In your present state of health, do you have any difficulty performing the following activities:  Hearing? 0  Vision? 0  Difficulty concentrating or making decisions? 0  Walking or climbing stairs? 0  Dressing or bathing? 0  Doing errands, shopping? 0  Preparing Food and eating ? N  Using the Toilet? N  In the past six months, have you accidently leaked urine? N  Do you have problems with loss of bowel control? N  Managing your Medications? N  Managing your Finances? N  Housekeeping or managing your Housekeeping? N    Patient Education/ Literacy How often do you need to have someone help you when you read instructions, pamphlets, or other written materials from your doctor or pharmacy?: 1 - Never What is the last grade level you completed in school?: associates degree  Exercise Current Exercise Habits: Home exercise routine, Type of exercise: walking, Time (Minutes): 50, Frequency (Times/Week): 4, Weekly Exercise (Minutes/Week): 200, Intensity: Moderate, Exercise limited by: None identified  Diet Patient reports consuming 3 meals a day and 1 snack(s) a day Patient reports that his primary diet is: Regular Patient reports that she does have regular access to food.   Depression  Screen    05/13/2022    9:37 AM 05/07/2022    8:13 AM 06/27/2021    8:18 AM 06/05/2020    8:19 AM 05/27/2019    8:12 AM 11/25/2018    8:05 AM 10/09/2017    8:19 AM  PHQ 2/9 Scores  PHQ - 2 Score 0 0 0 0 0 0 0     Fall Risk    05/13/2022    9:37 AM 05/07/2022    8:13 AM 06/27/2021    8:18 AM 06/05/2020    8:20 AM 05/27/2019    8:12 AM  Fall Risk   Falls in the past year? 0 0 0 0 0  Number falls in past yr: 0 0 0  0  Injury with Fall? 0 0 0  0  Risk for fall due to : No Fall Risks No Fall Risks No Fall Risks No Fall Risks   Follow up Falls evaluation completed Falls evaluation completed Falls prevention discussed;Falls evaluation completed       Objective:  HELMUTH RECUPERO seemed alert and oriented and he participated appropriately during our telephone visit.  Blood Pressure Weight BMI  BP Readings from Last 3 Encounters:  05/07/22 129/78  12/26/21 117/67  06/27/21 137/81   Wt Readings from Last 3 Encounters:  05/07/22 230 lb (104.3 kg)  12/26/21 230 lb (104.3 kg)  06/27/21 230 lb (104.3 kg)   BMI Readings from Last 1 Encounters:  05/07/22 33.00 kg/m    *Unable to obtain current vital signs, weight, and BMI due to telephone visit type  Hearing/Vision  Viraat did not seem to have difficulty with hearing/understanding during the telephone conversation Reports that he has had a formal eye exam by an eye care professional within the past year Reports that he has not had a formal hearing evaluation within the past year *Unable to fully assess hearing and vision during telephone visit type  Cognitive Function:    05/13/2022    9:41 AM 07/11/2016    9:31 AM  6CIT  Screen  What Year? 0 points 0 points  What month? 0 points 0 points  What time? 0 points 0 points  Count back from 20 0 points 0 points  Months in reverse 0 points 0 points  Repeat phrase 0 points 0 points  Total Score 0 points 0 points   (Normal:0-7, Significant for Dysfunction: >8)  Normal  Cognitive Function Screening: Yes   Immunization & Health Maintenance Record Immunization History  Administered Date(s) Administered   Fluad Quad(high Dose 65+) 03/01/2020   Influenza Split 03/23/2012   Influenza Whole 04/12/2009, 02/25/2011   Influenza, High Dose Seasonal PF 05/18/2015, 03/09/2017, 03/01/2018, 03/07/2019, 04/16/2022   Influenza-Unspecified 03/21/2013, 04/17/2021   Moderna Covid-19 Vaccine Bivalent Booster 92yr & up 03/17/2022   Moderna Sars-Covid-2 Vaccination 07/31/2019, 08/28/2019, 03/29/2020   Pneumococcal Conjugate-13 12/23/2014   Pneumococcal Polysaccharide-23 01/10/2016   Td 10/08/2005   Tdap 06/18/2015   Zoster, Live 02/21/2011    Health Maintenance  Topic Date Due   COVID-19 Vaccine (5 - 2023-24 season) 05/29/2022 (Originally 05/12/2022)   Zoster Vaccines- Shingrix (1 of 2) 03/29/2023 (Originally 11/05/1999)   Medicare Annual Wellness ([place]  05/14/2023   COLONOSCOPY (Pts 45-476yrInsurance coverage will need to be confirmed)  12/07/2026   Pneumonia Vaccine 6546Years old  Completed   INFLUENZA VACCINE  Completed   Hepatitis C Screening  Completed   HPV VACCINES  Aged Out       Assessment  This is a routine wellness examination for [facility] Health Maintenance: Due or Overdue There are no preventive care reminders to display for this patient.   ThGildardo Griffestaniszewski does not need a referral for [employer] Companyssistance: Care Management:   no Social Work:    no Prescription Assistance:  not applicable Nutrition/Diabetes Education:  no   Plan:  Personalized Goals  Goals Addressed               This Visit's Progress     Patient Stated (pt-stated)        Patient stated that he would like to loose 20 lbs.       Personalized Health Maintenance & Screening Recommendations  Shingles vaccine  Lung Cancer Screening Recommended: no (Low Dose CT Chest recommended if Age 72-80ears, 30 pack-year currently smoking OR have quit  w/in past 15 years) Hepatitis C Screening recommended: no HIV Screening recommended: no  Advanced Directives: Written information was not prepared per patient's request.  Referrals & Orders No orders of the defined types were placed in this encounter.   Follow-up Plan Follow-up with MeHali MarryMD as planned Schedule shingles vaccine at the pharmacy. Medicare wellness visit in one year. Patient will access AVS on my chart.   I have personally reviewed and noted the following in the patient's chart:   Medical and social history Use of alcohol, tobacco or illicit drugs  Current medications and supplements Functional ability and status Nutritional status Physical activity Advanced directives List of other physicians Hospitalizations, surgeries, and ER visits in previous 12 months Vitals Screenings to include cognitive, depression, and falls Referrals and appointments  In addition, I have reviewed and discussed with ThOsborn Cohoertain preventive protocols, quality metrics, and best practice recommendations. A written personalized care plan for preventive services as well as general preventive health recommendations is available and can be mailed to the patient at his request.      BaTinnie GensRN BSN  05/13/2022

## 2022-05-13 NOTE — Patient Instructions (Signed)
[facility] Maintenance Summary and Written Plan of Care  Zachary Miranda ,  Thank you for allowing me to perform your Medicare Annual Wellness Visit and for your ongoing commitment to your health.   Health Maintenance & Immunization History Health Maintenance  Topic Date Due   COVID-19 Vaccine (5 - 2023-24 season) 05/29/2022 (Originally 05/12/2022)   Zoster Vaccines- Shingrix (1 of 2) 03/29/2023 (Originally 11/05/1999)   Medicare Annual Wellness (AWV)  05/14/2023   COLONOSCOPY (Pts 45-45yr Insurance coverage will need to be confirmed)  12/07/2026   Pneumonia Vaccine 72 Years old  Completed   INFLUENZA VACCINE  Completed   Hepatitis C Screening  Completed   HPV VACCINES  Aged Out   Immunization History  Administered Date(s) Administered   Fluad Quad(high Dose 72+) 03/01/2020   Influenza Split 03/23/2012   Influenza Whole 04/12/2009, 02/25/2011   Influenza, High Dose Seasonal PF 05/18/2015, 03/09/2017, 03/01/2018, 03/07/2019, 04/16/2022   Influenza-Unspecified 03/21/2013, 04/17/2021   Moderna Covid-19 Vaccine Bivalent Booster 143yr& up 03/17/2022   Moderna Sars-Covid-2 Vaccination 07/31/2019, 08/28/2019, 03/29/2020   Pneumococcal Conjugate-13 12/23/2014   Pneumococcal Polysaccharide-23 01/10/2016   Td 10/08/2005   Tdap 06/18/2015   Zoster, Live 02/21/2011    These are the patient goals that we discussed:  Goals Addressed               This Visit's Progress     Patient Stated (pt-stated)        Patient stated that he would like to loose 20 lbs.         This is a list of Health Maintenance Items that are overdue or due now: Shingles vaccine    Orders/Referrals Placed Today: No orders of the defined types were placed in this encounter.  (Contact our referral department at [phone] you have not spoken with someone about your referral appointment within the next 5 days)    Follow-up Plan Follow-up with MeHali MarryMD as planned Schedule shingles vaccine at the pharmacy. Medicare wellness visit in one year. Patient will access AVS on my chart.     Health Maintenance, Male Adopting a healthy lifestyle and getting preventive care are important in promoting health and wellness. Ask your health care provider about: The right schedule for you to have regular tests and exams. Things you can do on your own to prevent diseases and keep yourself healthy. What should I know about diet, weight, and exercise? Eat a healthy diet  Eat a diet that includes plenty of vegetables, fruits, low-fat dairy products, and lean protein. Do not eat a lot of foods that are high in solid fats, added sugars, or sodium. Maintain a healthy weight Body mass index (BMI) is a measurement that can be used to identify possible weight problems. It estimates body fat based on height and weight. Your health care provider can help determine your BMI and help you achieve or maintain a healthy weight. Get regular exercise Get regular exercise. This is one of the most important things you can do for your health. Most adults should: Exercise for at least 150 minutes each week. The exercise should increase your heart rate and make you sweat (moderate-intensity exercise). Do strengthening exercises at least twice a week. This is in addition to the moderate-intensity exercise. Spend less time sitting. Even light physical activity can be beneficial. Watch cholesterol and blood lipids Have your blood tested for lipids and cholesterol at 2031ears of age, then have this test every  5 years. You may need to have your cholesterol levels checked more often if: Your lipid or cholesterol levels are high. You are older than 72 years of age. You are at high risk for heart disease. What should I know about cancer screening? Many types of cancers can be detected early and may often be prevented. Depending on your health history and family history, you  may need to have cancer screening at various ages. This may include screening for: Colorectal cancer. Prostate cancer. Skin cancer. Lung cancer. What should I know about heart disease, diabetes, and high blood pressure? Blood pressure and heart disease High blood pressure causes heart disease and increases the risk of stroke. This is more likely to develop in people who have high blood pressure readings or are overweight. Talk with your health care provider about your target blood pressure readings. Have your blood pressure checked: Every 3-5 years if you are 72-64 years of age. Every year if you are 72 years old or older. If you are between the ages of 50 and 65 and are a current or former smoker, ask your health care provider if you should have a one-time screening for abdominal aortic aneurysm (AAA). Diabetes Have regular diabetes screenings. This checks your fasting blood sugar level. Have the screening done: Once every three years after age 72 if you are at a normal weight and have a low risk for diabetes. More often and at a younger age if you are overweight or have a high risk for diabetes. What should I know about preventing infection? Hepatitis B If you have a higher risk for hepatitis B, you should be screened for this virus. Talk with your health care provider to find out if you are at risk for hepatitis B infection. Hepatitis C Blood testing is recommended for: Everyone born from [birth year] through 1965. Anyone with known risk factors for hepatitis C. Sexually transmitted infections (STIs) You should be screened each year for STIs, including gonorrhea and chlamydia, if: You are sexually active and are younger than 72 years of age. You are older than 72 years of age and your health care provider tells you that you are at risk for this type of infection. Your sexual activity has changed since you were last screened, and you are at increased risk for chlamydia or gonorrhea. Ask your  health care provider if you are at risk. Ask your health care provider about whether you are at high risk for HIV. Your health care provider may recommend a prescription medicine to help prevent HIV infection. If you choose to take medicine to prevent HIV, you should first get tested for HIV. You should then be tested every 3 months for as long as you are taking the medicine. Follow these instructions at home: Alcohol use Do not drink alcohol if your health care provider tells you not to drink. If you drink alcohol: Limit how much you have to 0-2 drinks a day. Know how much alcohol is in your drink. In the [place] one drink equals one 12 oz bottle of beer (355 mL), one 5 oz glass of wine (148 mL), or one 1 oz glass of hard liquor (44 mL). Lifestyle Do not use any products that contain nicotine or tobacco. These products include cigarettes, chewing tobacco, and vaping devices, such as e-cigarettes. If you need help quitting, ask your health care provider. Do not use street drugs. Do not share needles. Ask your health care provider for help if you need support or  information about quitting drugs. General instructions Schedule regular health, dental, and eye exams. Stay current with your vaccines. Tell your health care provider if: You often feel depressed. You have ever been abused or do not feel safe at home. Summary Adopting a healthy lifestyle and getting preventive care are important in promoting health and wellness. Follow your health care provider's instructions about healthy diet, exercising, and getting tested or screened for diseases. Follow your health care provider's instructions on monitoring your cholesterol and blood pressure. This information is not intended to replace advice given to you by your health care provider. Make sure you discuss any questions you have with your health care provider. Document Revised: 10/23/2020 Document Reviewed: 10/23/2020 Elsevier Patient Education   [place].

## 2022-05-14 ENCOUNTER — Encounter: Payer: Self-pay | Admitting: *Deleted

## 2022-05-14 ENCOUNTER — Other Ambulatory Visit: Payer: Self-pay | Admitting: Family Medicine

## 2022-05-14 DIAGNOSIS — I10 Essential (primary) hypertension: Secondary | ICD-10-CM

## 2022-05-29 ENCOUNTER — Other Ambulatory Visit: Payer: Self-pay | Admitting: Family Medicine

## 2022-05-29 DIAGNOSIS — J40 Bronchitis, not specified as acute or chronic: Secondary | ICD-10-CM

## 2022-07-01 ENCOUNTER — Ambulatory Visit (INDEPENDENT_AMBULATORY_CARE_PROVIDER_SITE_OTHER): Payer: Medicare Other | Admitting: Family Medicine

## 2022-07-01 ENCOUNTER — Encounter: Payer: Self-pay | Admitting: Family Medicine

## 2022-07-01 VITALS — BP 116/63 | HR 64 | Ht 70.0 in | Wt 229.0 lb

## 2022-07-01 DIAGNOSIS — R7301 Impaired fasting glucose: Secondary | ICD-10-CM

## 2022-07-01 DIAGNOSIS — I10 Essential (primary) hypertension: Secondary | ICD-10-CM | POA: Diagnosis not present

## 2022-07-01 LAB — POCT GLYCOSYLATED HEMOGLOBIN (HGB A1C): Hemoglobin A1C: 7.2 % — AB (ref 4.0–5.6)

## 2022-07-01 MED ORDER — VALSARTAN-HYDROCHLOROTHIAZIDE 320-12.5 MG PO TABS: 1.0000 | ORAL_TABLET | Freq: Every day | ORAL | 3 refills | Status: DC

## 2022-07-01 NOTE — Progress Notes (Signed)
Established Patient Office Visit  Subjective   Patient ID: Zachary Miranda, male    DOB: March 27, 1951  Age: 72 y.o. MRN: [id number]  Chief Complaint  Patient presents with   Hypertension   ifg    HPI   Hypertension- Pt denies chest pain, SOB, dizziness, or heart palpitations.  Taking meds as directed w/o problems.  Denies medication side effects.    Impaired fasting glucose-no increased thirst or urination. No symptoms consistent with hypoglycemia.  Says he is finally feeling better from a respiratory virus that he had that started back in October.  He says it took a most 2 months for his cough to completely resolve and 2 rounds of antibiotics.  He finally feels better.  He says the thing that helped the most was actually an inhaler.    ROS    Objective:     BP 116/63   Pulse 64   Ht '5\' 10"'$  (1.778 m)   Wt 229 lb (103.9 kg)   SpO2 97%   BMI 32.86 kg/m    Physical Exam Constitutional:      Appearance: He is well-developed.  HENT:     Head: Normocephalic and atraumatic.  Cardiovascular:     Rate and Rhythm: Normal rate and regular rhythm.     Heart sounds: Normal heart sounds.  Pulmonary:     Effort: Pulmonary effort is normal.     Breath sounds: Normal breath sounds.  Skin:    General: Skin is warm and dry.  Neurological:     Mental Status: He is alert and oriented to person, place, and time.  Psychiatric:        Behavior: Behavior normal.      Results for orders placed or performed in visit on 07/01/22  POCT glycosylated hemoglobin (Hb A1C)  Result Value Ref Range   Hemoglobin A1C 7.2 (A) 4.0 - 5.6 %   HbA1c POC (<> result, manual entry)     HbA1c, POC (prediabetic range)     HbA1c, POC (controlled diabetic range)        The ASCVD Risk score (Arnett DK, et al., 2019) failed to calculate for the following reasons:   The valid total cholesterol range is 130 to 320 mg/dL    Assessment & Plan:   Problem List Items Addressed This Visit        Cardiovascular and Mediastinum   HYPERTENSION, BENIGN SYSTEMIC - Primary    Well controlled. Continue current regimen. Follow up in  3-4 mo       Relevant Medications   valsartan-hydrochlorothiazide (DIOVAN-HCT) 320-12.5 MG tablet   Other Relevant Orders   BASIC METABOLIC PANEL WITH GFR   Hemoglobin A1c     Endocrine   IFG (impaired fasting glucose)    A1C up to 7.2 today.  Verify A1c on been puncture he has been prediabetes for several years we been following him very closely he did eat a few more things over the holidays but says overall has not had really any major changes in his diet.  He is not exercising as consistently but is already back on track and has been doing the treadmill for the last 2 weeks 5 days a week.  No up in 90 days.      Relevant Orders   POCT glycosylated hemoglobin (Hb A1C) (Completed)   BASIC METABOLIC PANEL WITH GFR   Hemoglobin A1c    We also discussed need for Prevnar vaccine and RSV vaccine.  He will think about  it.  He also needs an updated pneumonia we discussed that as well.  No follow-ups on file.    Beatrice Lecher, MD

## 2022-07-01 NOTE — Assessment & Plan Note (Addendum)
A1C up to 7.2 today.  Verify A1c on been puncture he has been prediabetes for several years we been following him very closely he did eat a few more things over the holidays but says overall has not had really any major changes in his diet.  He is not exercising as consistently but is already back on track and has been doing the treadmill for the last 2 weeks 5 days a week.  No up in 90 days.

## 2022-07-01 NOTE — Assessment & Plan Note (Signed)
Well controlled. Continue current regimen. Follow up in  3-4 mo  

## 2022-07-02 LAB — HEMOGLOBIN A1C
Hgb A1c MFr Bld: 6.5 % of total Hgb — ABNORMAL HIGH (ref <5.7)
Mean Plasma Glucose: 140 mg/dL
eAG (mmol/L): 7.7 mmol/L

## 2022-07-02 LAB — BASIC METABOLIC PANEL WITH GFR
BUN: 20 mg/dL (ref 7–25)
CO2: 29 mmol/L (ref 20–32)
Calcium: 9.7 mg/dL (ref 8.6–10.3)
Chloride: 107 mmol/L (ref 98–110)
Creat: 0.97 mg/dL (ref 0.70–1.28)
Glucose, Bld: 109 mg/dL — ABNORMAL HIGH (ref 65–99)
Potassium: 4.5 mmol/L (ref 3.5–5.3)
Sodium: 143 mmol/L (ref 135–146)
eGFR: 83 mL/min/{1.73_m2} (ref >=60)

## 2022-07-02 NOTE — Progress Notes (Signed)
Zachary Miranda, metabolic panel looks good.  The A1c on the blood test was actually much better it was 6-1/2 that makes me feel much better about the A1c than the 7.2.  We do run weekly controls on our machines so I will have our staff from them again to make sure that we do not have any inaccuracies.  That is definitely reassuring.  Though 6.5 is still technically in the diabetes range versus prediabetes.  I really like for you to work on cutting back on sugars and carbs and beef and up the exercise and then planning to recheck again in 3 to 4 months instead of 6 months.

## 2022-08-22 IMAGING — CT CT CHEST W/O CM
2 of 4 series · 15 of 36 positions shown, 18 images · non-contrast
Comparison: CT December 23, 2019

CLINICAL DATA: Follow-up pulmonary nodules.

EXAM:
CT CHEST WITHOUT CONTRAST
TECHNIQUE: Multidetector CT imaging of the chest was performed following the
standard protocol without IV contrast.

[Series 2: thorax · axial · 0.78mm/px · z∈[-308,-32]mm · 12 of 164 slices shown, 15 images]
[im 13/164  mediastinal]
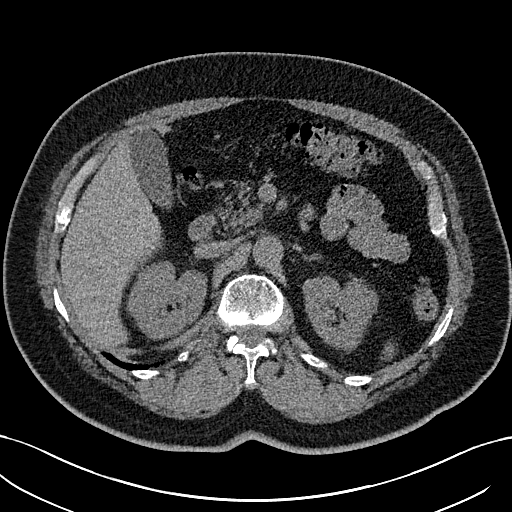
[im 13/164  lung]
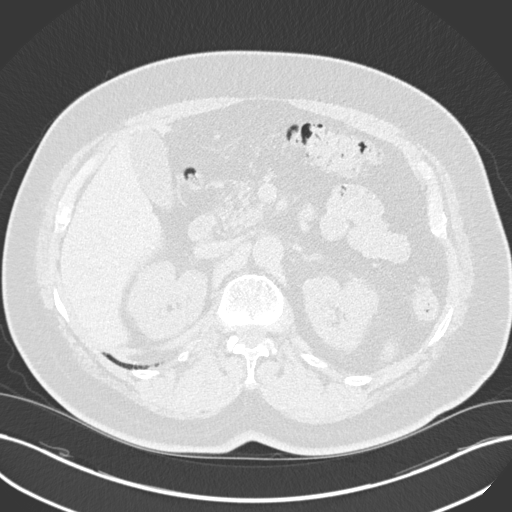
[im 26/164  lung]
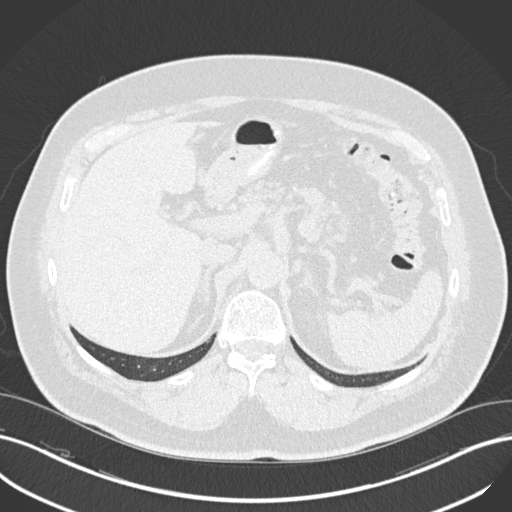
[im 38/164  lung]
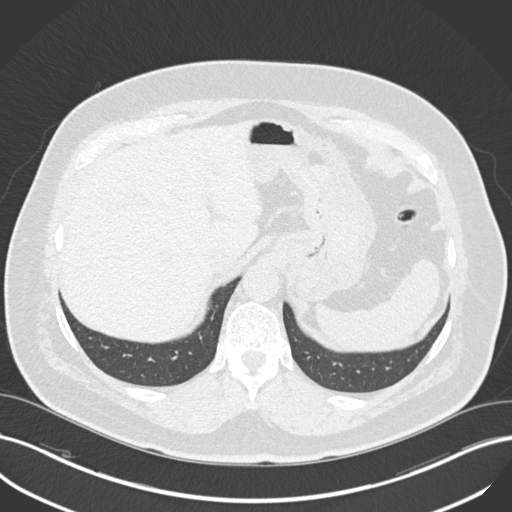
[im 51/164  lung]
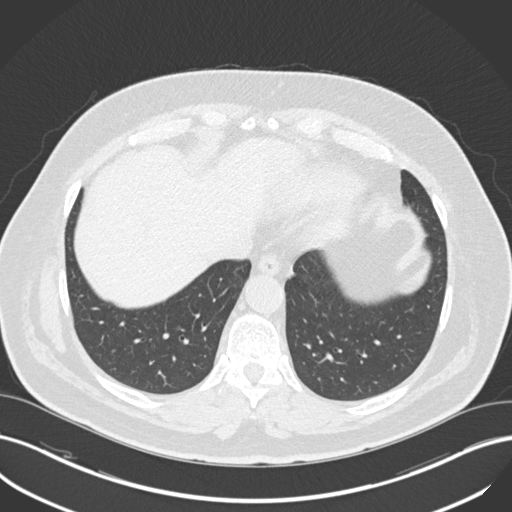
[im 63/164  mediastinal]
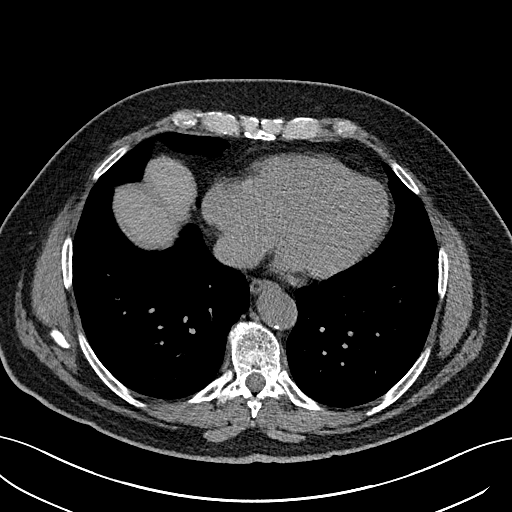
[im 63/164  lung]
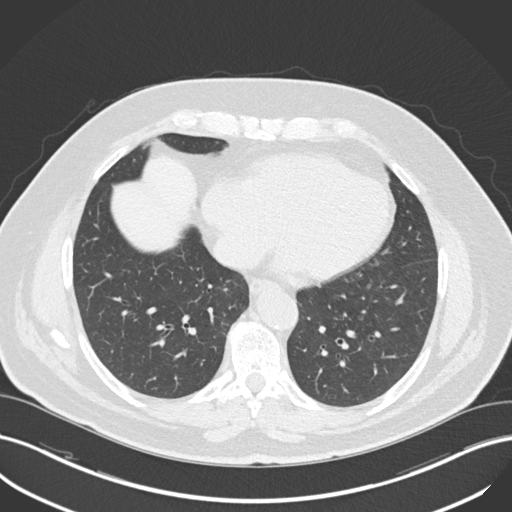
[im 76/164  lung]
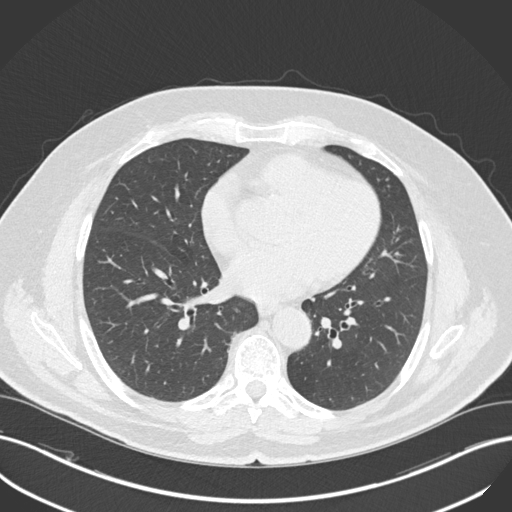
[im 88/164  lung]
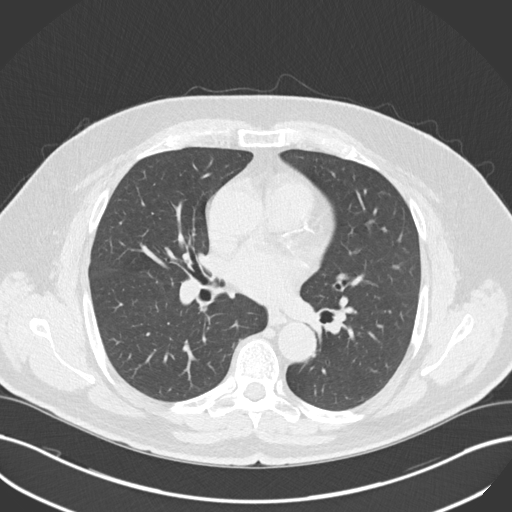
[im 101/164  lung]
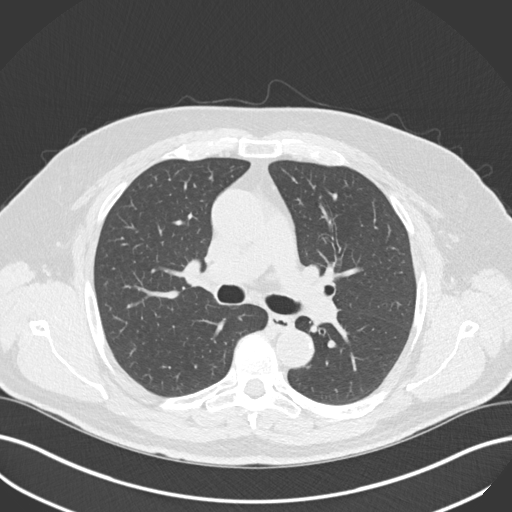
[im 113/164  mediastinal]
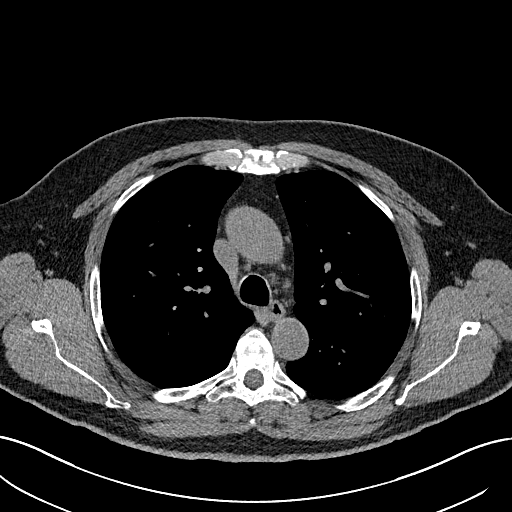
[im 113/164  lung]
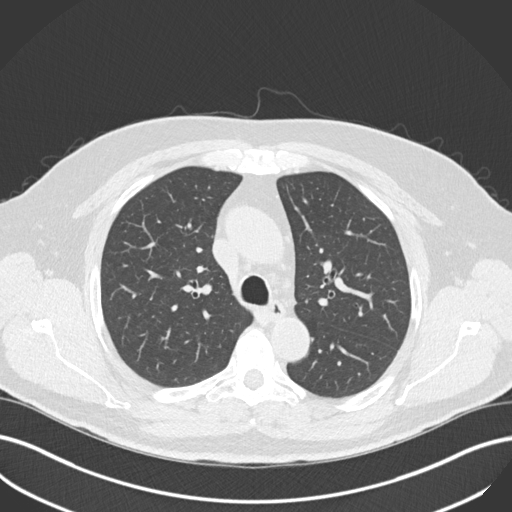
[im 126/164  lung]
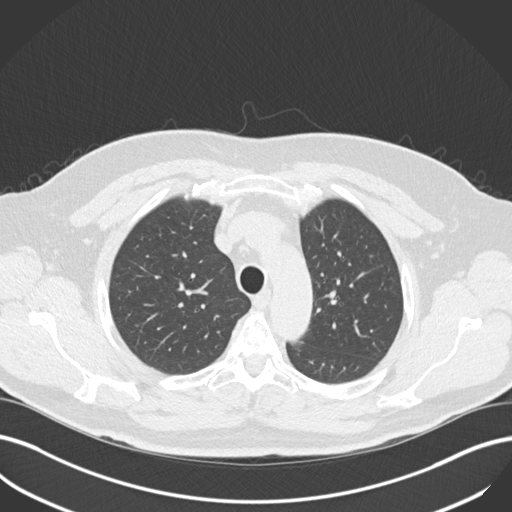
[im 138/164  lung]
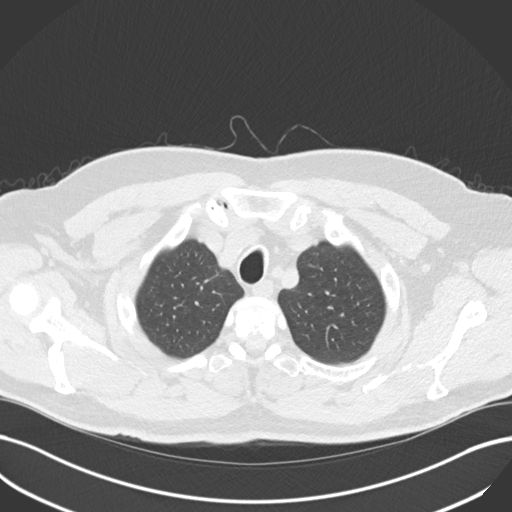
[im 151/164  lung]
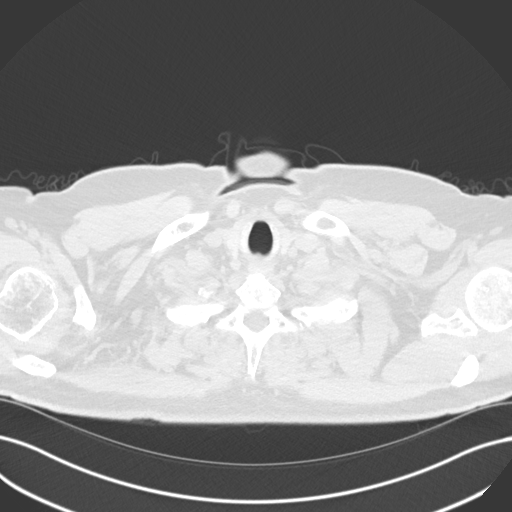

[Series 5: coronal · coronal · 0.64mm/px · 3 of 151 slices shown]
[im 31/151  lung]
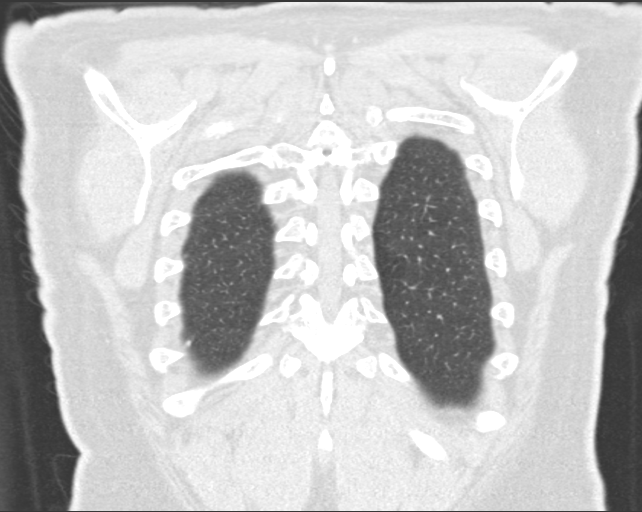
[im 61/151  lung]
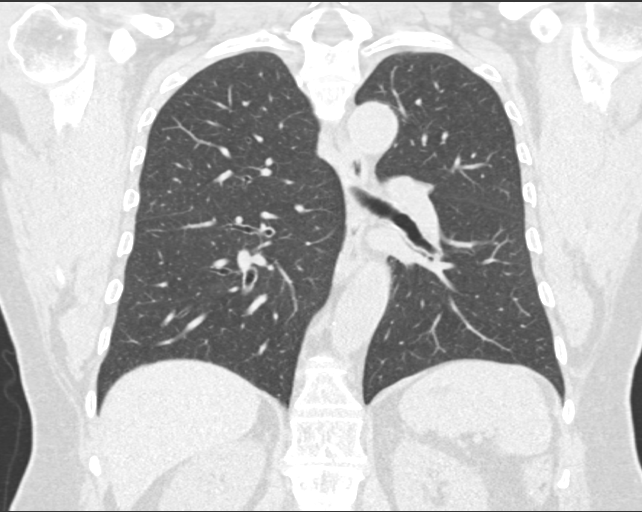
[im 91/151  lung]
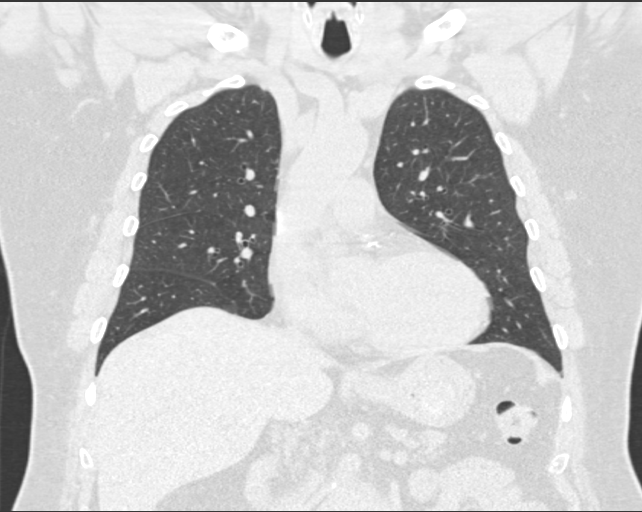

[15 of 36 positions shown; findings below may reference images not displayed]

FINDINGS: Cardiovascular: Aortic and branch vessel atherosclerosis. Mild
aneurysmal dilation the ascending aorta med 4 cm on image 66/2,
unchanged. Normal caliber central pulmonary arteries. Normal size
heart. Coronary artery calcifications. No significant pleural
effusion/thickening.

Mediastinum/Nodes: No discrete thyroid nodule. No pathologically
enlarged mediastinal, hilar or axillary lymph nodes, noting limited
sensitivity for the detection of hilar adenopathy on this
noncontrast study. The trachea and esophagus are unremarkable.

Lungs/Pleura: Stable small right-sided pulmonary nodules the largest
of which is a subpleural nodule in the medial aspect of the right
lower lobe measuring 3 mm on image 67/3. No new suspicious pulmonary
nodules or masses. No airspace consolidation. No pleural effusion.
No pneumothorax.

Upper Abdomen: No acute abnormality.

Musculoskeletal: Thoracic spondylosis. No acute osseous abnormality.
IMPRESSION: 1. Stable small pulmonary nodules measuring up to 3 mm. No new
suspicious pulmonary nodules or masses.
2. Stable mild aneurysmal dilation of the ascending aorta measuring
up to 4 cm. Recommend annual imaging followup by CTA or MRA. This
recommendation follows 5232
ACCF/AHA/AATS/ACR/ASA/SCA/IWASA/IRWIN/IRIGOYEN/CHRISTIE Guidelines for the
Diagnosis and Management of Patients with Thoracic Aortic Disease.
Circulation. 5232; 121: E266-e369. Aortic aneurysm NOS (PDEF0-P4P.Q)
3.  Aortic Atherosclerosis (PDEF0-1A7.7).

## 2022-09-30 ENCOUNTER — Encounter: Payer: Self-pay | Admitting: Family Medicine

## 2022-09-30 ENCOUNTER — Ambulatory Visit (INDEPENDENT_AMBULATORY_CARE_PROVIDER_SITE_OTHER): Payer: Medicare Other | Admitting: Family Medicine

## 2022-09-30 VITALS — BP 120/65 | HR 64 | Ht 70.0 in | Wt 218.0 lb

## 2022-09-30 DIAGNOSIS — I10 Essential (primary) hypertension: Secondary | ICD-10-CM

## 2022-09-30 DIAGNOSIS — R7301 Impaired fasting glucose: Secondary | ICD-10-CM

## 2022-09-30 LAB — POCT GLYCOSYLATED HEMOGLOBIN (HGB A1C): Hemoglobin A1C: 5.8 % — AB (ref 4.0–5.6)

## 2022-09-30 NOTE — Assessment & Plan Note (Signed)
Once he is much improved down to 5.8 from prior of 6.5.  And he is lost 11 pounds.  Encouraged him to continue to work on healthy diet and regular exercise.  He is made some great changes!

## 2022-09-30 NOTE — Assessment & Plan Note (Signed)
Well controlled. Continue current regimen. Follow up in  6 mo  

## 2022-09-30 NOTE — Progress Notes (Addendum)
   Established Patient Office Visit  Subjective   Patient ID: Zachary Miranda, male    DOB: [birth year]  Age: 72 y.o. MRN: [id number]  Chief Complaint  Patient presents with   ifg   Hypertension    HPI  Impaired fasting glucose-no increased thirst or urination. No symptoms consistent with hypoglycemia.  Hypertension- Pt denies chest pain, SOB, dizziness, or heart palpitations.  Taking meds as directed w/o problems.  Denies medication side effects.  Home blood pressures have been running 132-135/77-82.  He has lost 11 lbs.   doing really well overall.  States he is really made some overhaul with diet he and his wife both.  He has been getting on the treadmill and doing 1 mile when he gets home from work 5 days a week.  He really would actually like to get down to 200 pounds now that he has been losing weight and doing well.     ROS    Objective:     BP 120/65   Pulse 64   Ht 5\' 10"  (1.778 m)   Wt 218 lb (98.9 kg)   SpO2 98%   BMI 31.28 kg/m    Physical Exam Constitutional:      Appearance: He is well-developed.  HENT:     Head: Normocephalic and atraumatic.  Cardiovascular:     Rate and Rhythm: Normal rate and regular rhythm.     Heart sounds: Normal heart sounds.  Pulmonary:     Effort: Pulmonary effort is normal.     Breath sounds: Normal breath sounds.  Skin:    General: Skin is warm and dry.  Neurological:     Mental Status: He is alert and oriented to person, place, and time.  Psychiatric:        Behavior: Behavior normal.      Results for orders placed or performed in visit on 09/30/22  POCT glycosylated hemoglobin (Hb A1C)  Result Value Ref Range   Hemoglobin A1C 5.8 (A) 4.0 - 5.6 %   HbA1c POC (<> result, manual entry)     HbA1c, POC (prediabetic range)     HbA1c, POC (controlled diabetic range)        The ASCVD Risk score (Arnett DK, et al., 2019) failed to calculate for the following reasons:   The valid total cholesterol range is 130  to 320 mg/dL    Assessment & Plan:   Problem List Items Addressed This Visit       Cardiovascular and Mediastinum   HYPERTENSION, BENIGN SYSTEMIC    Well controlled. Continue current regimen. Follow up in  6 mo         Endocrine   IFG (impaired fasting glucose) - Primary    Once he is much improved down to 5.8 from prior of 6.5.  And he is lost 11 pounds.  Encouraged him to continue to work on healthy diet and regular exercise.  He is made some great changes!      Relevant Orders   POCT glycosylated hemoglobin (Hb A1C) (Completed)    Return in about 6 months (around 03/18/2023) for Pre-diabetes, Hypertension, fasting labs. Nani Gasser, MD

## 2022-10-08 DIAGNOSIS — R059 Cough, unspecified: Secondary | ICD-10-CM | POA: Diagnosis not present

## 2022-10-08 DIAGNOSIS — R0982 Postnasal drip: Secondary | ICD-10-CM | POA: Diagnosis not present

## 2022-10-08 DIAGNOSIS — J029 Acute pharyngitis, unspecified: Secondary | ICD-10-CM | POA: Diagnosis not present

## 2022-10-08 DIAGNOSIS — B9689 Other specified bacterial agents as the cause of diseases classified elsewhere: Secondary | ICD-10-CM | POA: Diagnosis not present

## 2022-10-08 DIAGNOSIS — J069 Acute upper respiratory infection, unspecified: Secondary | ICD-10-CM | POA: Diagnosis not present

## 2022-11-06 ENCOUNTER — Other Ambulatory Visit: Payer: Self-pay | Admitting: Family Medicine

## 2022-11-06 DIAGNOSIS — E785 Hyperlipidemia, unspecified: Secondary | ICD-10-CM

## 2022-11-06 DIAGNOSIS — E7800 Pure hypercholesterolemia, unspecified: Secondary | ICD-10-CM

## 2022-11-19 ENCOUNTER — Ambulatory Visit (INDEPENDENT_AMBULATORY_CARE_PROVIDER_SITE_OTHER): Payer: Medicare Other

## 2022-11-19 ENCOUNTER — Ambulatory Visit (INDEPENDENT_AMBULATORY_CARE_PROVIDER_SITE_OTHER): Payer: Medicare Other | Admitting: Sports Medicine

## 2022-11-19 DIAGNOSIS — M19041 Primary osteoarthritis, right hand: Secondary | ICD-10-CM | POA: Diagnosis not present

## 2022-11-19 DIAGNOSIS — M1811 Unilateral primary osteoarthritis of first carpometacarpal joint, right hand: Secondary | ICD-10-CM | POA: Diagnosis not present

## 2022-11-19 DIAGNOSIS — M170 Bilateral primary osteoarthritis of knee: Secondary | ICD-10-CM | POA: Diagnosis not present

## 2022-11-19 MED ORDER — MELOXICAM 15 MG PO TABS
ORAL_TABLET | ORAL | 3 refills | Status: AC
Start: 2022-11-19 — End: ?

## 2022-11-19 NOTE — Progress Notes (Signed)
    Procedures performed today:    None.  Independent interpretation of notes and tests performed by another provider:   None.  Brief History, Exam, Impression, and Recommendations:    Primary osteoarthritis of first carpometacarpal joint of right hand This is a very pleasant 73 year old male, he does have pain at the thumb basal joint on the right, Aleve seems to help, on exam he does have minimal pain thumb basal joint without the squared off appearance, he also has some discomfort dorsal wrist. He had x-rays several years ago that did show first CMC OA, repeating x-rays today, switching from Aleve to meloxicam, adding home physical therapy, return to see me in 6 weeks, injection if not better.  Primary osteoarthritis of both knees This is a very pleasant 72 year old male, no knee osteoarthritis, he has been through a multitude of treatment including steroid injections, viscosupplementation with Synvisc, PRP, as well as amniotic treatment. He is doing okay for now, I did explain him we would start conservatively if he would like me to treat his knees, we can certainly do steroid injections, Synvisc and PRP, we do not offer amniotic treatment today. Of note the practice he went to for amniotic treatment charges $7500 per knee.    ____________________________________________ Ihor Austin. Benjamin Stain, M.D., ABFM., CAQSM., AME. Primary Care and Sports Medicine [facility]  Adjunct Professor of Family Medicine  [employer] [facility] [employer]  Restaurant manager, fast food

## 2022-11-19 NOTE — Assessment & Plan Note (Signed)
This is a very pleasant 72 year old male, he does have pain at the thumb basal joint on the right, Aleve seems to help, on exam he does have minimal pain thumb basal joint without the squared off appearance, he also has some discomfort dorsal wrist. He had x-rays several years ago that did show first CMC OA, repeating x-rays today, switching from Aleve to meloxicam, adding home physical therapy, return to see me in 6 weeks, injection if not better.

## 2022-11-19 NOTE — Assessment & Plan Note (Signed)
This is a very pleasant 72 year old male, no knee osteoarthritis, he has been through a multitude of treatment including steroid injections, viscosupplementation with Synvisc, PRP, as well as amniotic treatment. He is doing okay for now, I did explain him we would start conservatively if he would like me to treat his knees, we can certainly do steroid injections, Synvisc and PRP, we do not offer amniotic treatment today. Of note the practice he went to for amniotic treatment charges $7500 per knee.

## 2022-11-26 NOTE — Telephone Encounter (Signed)
Patient returned call about results for x-rays  Please call [phone]

## 2023-01-02 ENCOUNTER — Ambulatory Visit (INDEPENDENT_AMBULATORY_CARE_PROVIDER_SITE_OTHER): Payer: Medicare Other | Admitting: Sports Medicine

## 2023-01-02 DIAGNOSIS — M1811 Unilateral primary osteoarthritis of first carpometacarpal joint, right hand: Secondary | ICD-10-CM | POA: Diagnosis not present

## 2023-01-02 NOTE — Progress Notes (Signed)
    Procedures performed today:    None.  Independent interpretation of notes and tests performed by another provider:   None.  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 First CMC pain is gone, he still has some radiocarpal pain. X-rays did show CMC and radiocarpal osteoarthritis, he is off of the meloxicam, he is doing home conditioning, he feels as though the pain is livable right now. He understands if it does worsen he can restart the meloxicam for 2 weeks, continue home conditioning and we can certainly try a radiocarpal versus a CMC injection if the above does not work.    ____________________________________________ Ihor Austin. Benjamin Stain, M.D., ABFM., CAQSM., AME. Primary Care and Sports Medicine [facility]  Adjunct Professor of Family Medicine  [employer] [place] [employer] [facility] [employer]  Restaurant manager, fast food

## 2023-01-02 NOTE — Assessment & Plan Note (Signed)
This very pleasant 72 year old male returns, we have been treating him for thumb basal joint arthritis, we switched to meloxicam and he had very good improvements. First CMC pain is gone, he still has some radiocarpal pain. X-rays did show CMC and radiocarpal osteoarthritis, he is off of the meloxicam, he is doing home conditioning, he feels as though the pain is livable right now. He understands if it does worsen he can restart the meloxicam for 2 weeks, continue home conditioning and we can certainly try a radiocarpal versus a CMC injection if the above does not work.

## 2023-03-18 ENCOUNTER — Ambulatory Visit: Payer: Medicare Other | Admitting: Family Medicine

## 2023-03-18 NOTE — Progress Notes (Deleted)
   Established Patient Office Visit  Subjective   Patient ID: Zachary Miranda, male    DOB: 1950/11/12  Age: 72 y.o. MRN: [id number]  No chief complaint on file.   HPI   Hypertension- Pt denies chest pain, SOB, dizziness, or heart palpitations.  Taking meds as directed w/o problems.  Denies medication side effects.    Impaired fasting glucose-no increased thirst or urination. No symptoms consistent with hypoglycemia.  {History (Optional):23778}  ROS    Objective:     There were no vitals taken for this visit. {Vitals History (Optional):23777}  Physical Exam Vitals and nursing note reviewed.  Constitutional:      Appearance: Normal appearance.  HENT:     Head: Normocephalic and atraumatic.  Eyes:     Conjunctiva/sclera: Conjunctivae normal.  Cardiovascular:     Rate and Rhythm: Normal rate and regular rhythm.  Pulmonary:     Effort: Pulmonary effort is normal.     Breath sounds: Normal breath sounds.  Skin:    General: Skin is warm and dry.  Neurological:     Mental Status: He is alert.  Psychiatric:        Mood and Affect: Mood normal.    No results found for any visits on 03/18/23.  {Labs (Optional):23779}  The ASCVD Risk score (Arnett DK, et al., 2019) failed to calculate for the following reasons:   The valid total cholesterol range is 130 to 320 mg/dL    Assessment & Plan:   Problem List Items Addressed This Visit       Cardiovascular and Mediastinum   HYPERTENSION, BENIGN SYSTEMIC - Primary     Endocrine   IFG (impaired fasting glucose)    No follow-ups on file.    Nani Gasser, MD

## 2023-04-29 ENCOUNTER — Ambulatory Visit (INDEPENDENT_AMBULATORY_CARE_PROVIDER_SITE_OTHER): Payer: Medicare Other | Admitting: Family Medicine

## 2023-04-29 ENCOUNTER — Encounter: Payer: Self-pay | Admitting: Family Medicine

## 2023-04-29 VITALS — BP 117/75 | HR 68 | Ht 70.0 in | Wt 214.0 lb

## 2023-04-29 DIAGNOSIS — E7800 Pure hypercholesterolemia, unspecified: Secondary | ICD-10-CM

## 2023-04-29 DIAGNOSIS — R7301 Impaired fasting glucose: Secondary | ICD-10-CM | POA: Diagnosis not present

## 2023-04-29 DIAGNOSIS — I10 Essential (primary) hypertension: Secondary | ICD-10-CM | POA: Diagnosis not present

## 2023-04-29 LAB — POCT GLYCOSYLATED HEMOGLOBIN (HGB A1C): Hemoglobin A1C: 6.1 % — AB (ref 4.0–5.6)

## 2023-04-29 NOTE — Assessment & Plan Note (Signed)
Up a little but still good number.  Continue to work on health diet aned exercise  Lab Results  Component Value Date   HGBA1C 6.1 (A) 04/29/2023

## 2023-04-29 NOTE — Assessment & Plan Note (Signed)
Doing well on zetia and simvastatin. Due to recheck liopids panel. LDL goal < 70

## 2023-04-29 NOTE — Assessment & Plan Note (Signed)
Well controlled. Continue current regimen. Follow up in  6 mo  

## 2023-04-29 NOTE — Progress Notes (Signed)
   Established Patient Office Visit  Subjective   Patient ID: Zachary Miranda, male    DOB: 12/31/1950  Age: 72 y.o. MRN: [id number]  Chief Complaint  Patient presents with   Hypertension   ifg    HPI  Hypertension- Pt denies chest pain, SOB, dizziness, or heart palpitations.  Taking meds as directed w/o problems.  Denies medication side effects.    Impaired fasting glucose-no increased thirst or urination. No symptoms consistent with hypoglycemia.     ROS    Objective:     BP 117/75   Pulse 68   Ht 5\' 10"  (1.778 m)   Wt 214 lb (97.1 kg)   SpO2 99%   BMI 30.71 kg/m    Physical Exam Vitals and nursing note reviewed.  Constitutional:      Appearance: Normal appearance.  HENT:     Head: Normocephalic and atraumatic.  Eyes:     Conjunctiva/sclera: Conjunctivae normal.  Cardiovascular:     Rate and Rhythm: Normal rate and regular rhythm.  Pulmonary:     Effort: Pulmonary effort is normal.     Breath sounds: Normal breath sounds.  Skin:    General: Skin is warm and dry.  Neurological:     Mental Status: He is alert.  Psychiatric:        Mood and Affect: Mood normal.      Results for orders placed or performed in visit on 04/29/23  POCT HgB A1C  Result Value Ref Range   Hemoglobin A1C 6.1 (A) 4.0 - 5.6 %   HbA1c POC (<> result, manual entry)     HbA1c, POC (prediabetic range)     HbA1c, POC (controlled diabetic range)        The ASCVD Risk score (Arnett DK, et al., 2019) failed to calculate for the following reasons:   The valid total cholesterol range is 130 to 320 mg/dL    Assessment & Plan:   Problem List Items Addressed This Visit       Cardiovascular and Mediastinum   HYPERTENSION, BENIGN SYSTEMIC - Primary    Well controlled. Continue current regimen. Follow up in  25mo       Relevant Orders   PSA   Lipid Panel With LDL/HDL Ratio   CMP14+EGFR     Endocrine   IFG (impaired fasting glucose)    Up a little but still good  number.  Continue to work on health diet aned exercise  Lab Results  Component Value Date   HGBA1C 6.1 (A) 04/29/2023         Relevant Orders   POCT HgB A1C (Completed)   PSA   Lipid Panel With LDL/HDL Ratio   CMP14+EGFR     Other   Hyperlipidemia    Doing well on zetia and simvastatin. Due to recheck liopids panel. LDL goal < 70       Return in about 6 months (around 10/27/2023) for Hypertension, Pre-diabetes.    Nani Gasser, MD

## 2023-04-30 LAB — LIPID PANEL WITH LDL/HDL RATIO
Cholesterol, Total: 128 mg/dL (ref 100–199)
HDL: 56 mg/dL (ref >39)
LDL Chol Calc (NIH): 56 mg/dL (ref 0–99)
LDL/HDL Ratio: 1 ratio (ref 0.0–3.6)
Triglycerides: 84 mg/dL (ref 0–149)
VLDL Cholesterol Cal: 16 mg/dL (ref 5–40)

## 2023-04-30 LAB — CMP14+EGFR
ALT: 19 [IU]/L (ref 0–44)
AST: 25 [IU]/L (ref 0–40)
Albumin: 4.2 g/dL (ref 3.8–4.8)
Alkaline Phosphatase: 84 [IU]/L (ref 44–121)
BUN/Creatinine Ratio: 14 (ref 10–24)
BUN: 13 mg/dL (ref 8–27)
Bilirubin Total: 0.7 mg/dL (ref 0.0–1.2)
CO2: 24 mmol/L (ref 20–29)
Calcium: 9.5 mg/dL (ref 8.6–10.2)
Chloride: 102 mmol/L (ref 96–106)
Creatinine, Ser: 0.92 mg/dL (ref 0.76–1.27)
Globulin, Total: 2.1 g/dL (ref 1.5–4.5)
Glucose: 103 mg/dL — ABNORMAL HIGH (ref 70–99)
Potassium: 4.8 mmol/L (ref 3.5–5.2)
Sodium: 140 mmol/L (ref 134–144)
Total Protein: 6.3 g/dL (ref 6.0–8.5)
eGFR: 88 mL/min/{1.73_m2} (ref >59)

## 2023-04-30 LAB — PSA: Prostate Specific Ag, Serum: 2 ng/mL (ref 0.0–4.0)

## 2023-04-30 NOTE — Progress Notes (Signed)
Hi [name], your metabolic panel overall looks good.  LDL at goal.  Prostate test is normal.

## 2023-05-15 ENCOUNTER — Other Ambulatory Visit: Payer: Self-pay | Admitting: Family Medicine

## 2023-05-15 DIAGNOSIS — I10 Essential (primary) hypertension: Secondary | ICD-10-CM

## 2023-05-19 ENCOUNTER — Encounter: Payer: Self-pay | Admitting: Family Medicine

## 2023-05-19 ENCOUNTER — Ambulatory Visit (INDEPENDENT_AMBULATORY_CARE_PROVIDER_SITE_OTHER): Payer: Medicare Other | Admitting: Family Medicine

## 2023-05-19 VITALS — Ht 70.0 in | Wt 212.0 lb

## 2023-05-19 DIAGNOSIS — Z0000 Encounter for general adult medical examination without abnormal findings: Secondary | ICD-10-CM

## 2023-05-19 NOTE — Progress Notes (Signed)
Subjective:   Zachary Miranda is a 72 y.o. male who presents for Medicare Annual/Subsequent preventive examination.  Visit Complete: Virtual I connected with  Pandora Leiter on 05/19/23 by a audio enabled telemedicine application and verified that I am speaking with the correct person using two identifiers.  Patient Location: Other:  work   Restaurant manager, fast food Location: Office/Clinic  I discussed the limitations of evaluation and management by telemedicine. The patient expressed understanding and agreed to proceed.  Vital Signs: Because this visit was a virtual/telehealth visit, some criteria may be missing or patient reported. Any vitals not documented were not able to be obtained and vitals that have been documented are patient reported.  Patient Medicare AWV questionnaire was completed by the patient on 05/19/23; I have confirmed that all information answered by patient is correct and no changes since this date.  Cardiac Risk Factors include: advanced age (>58men, >46 women);male gender;obesity (BMI >30kg/m2);hypertension;dyslipidemia     Objective:    Today's Vitals   05/19/23 0950  Weight: 212 lb (96.2 kg)  Height: 5\' 10"  (1.778 m)   Body mass index is 30.42 kg/m.     05/19/2023    9:59 AM 05/13/2022    9:37 AM 07/11/2016    9:23 AM 12/23/2014    8:29 AM  Advanced Directives  Does Patient Have a Medical Advance Directive? Yes Yes Yes No  Type of Advance Directive Living will Living will Living will   Does patient want to make changes to medical advance directive? No - Patient declined No - Patient declined No - Patient declined   Copy of Healthcare Power of Attorney in Chart?   No - copy requested   Would patient like information on creating a medical advance directive?    Yes - Educational materials given    Current Medications (verified) Outpatient Encounter Medications as of 05/19/2023  Medication Sig   albuterol (VENTOLIN HFA) 108 (90 Base) MCG/ACT inhaler TAKE 2  PUFFS BY MOUTH EVERY 6 HOURS AS NEEDED FOR WHEEZE OR SHORTNESS OF BREATH   AMBULATORY NON FORMULARY MEDICATION Take 2 tablets by mouth daily. Medication Name: GOLO Release before meal   cholecalciferol (VITAMIN D) 1000 UNITS tablet Take 1,000 Units by mouth daily.   co-enzyme Q-10 30 MG capsule Take 30 mg by mouth daily.   ezetimibe (ZETIA) 10 MG tablet TAKE 1 TABLET BY MOUTH DAILY   fluticasone (FLONASE) 50 MCG/ACT nasal spray One spray in each nostril twice a day, use left hand for right nostril, and right hand for left nostril.   Multiple Vitamins-Minerals (MULTIVITAMIN WITH MINERALS) tablet Take 1 tablet by mouth daily.   Omega-3 Fatty Acids (FISH OIL) 1000 MG CAPS Take by mouth.   simvastatin (ZOCOR) 40 MG tablet TAKE 1 TABLET BY MOUTH DAILY   valsartan-hydrochlorothiazide (DIOVAN-HCT) 320-12.5 MG tablet TAKE 1 TABLET BY MOUTH DAILY   meloxicam (MOBIC) 15 MG tablet One tab PO every 24 hours with a meal for 2 weeks, then once every 24 hours prn pain. (Patient not taking: Reported on 05/19/2023)   No facility-administered encounter medications on file as of 05/19/2023.    Allergies (verified) Atorvastatin, Prochlorperazine, and Rosuvastatin   History: Past Medical History:  Diagnosis Date   Allergy    Hyperlipidemia    Hypertension    Migraines    occular   Past Surgical History:  Procedure Laterality Date   LASIK     MANDIBLE SURGERY     Family History  Problem Relation Age of Onset  Cancer Sister    Parkinson's disease Sister    Cancer Father        melanoma, died age 17   Heart disease Father 60       MI   Heart attack Father    AAA (abdominal aortic aneurysm) Brother    Heart disease Brother    COPD Brother    Sleep apnea Brother    Heart disease Brother    Alzheimer's disease Brother        Lew Body Dementia, 59   Thyroid disease Mother 71       died 17   Social History   Socioeconomic History   Marital status: Married    Spouse name: Crist Infante.   Number  of children: 0   Years of education: 14   Highest education level: Associate degree: academic program  Occupational History   Occupation: Delivery/part-time    Comment: Full-time  Tobacco Use   Smoking status: Never   Smokeless tobacco: Never  Vaping Use   Vaping status: Never Used  Substance and Sexual Activity   Alcohol use: Yes    Alcohol/week: 7.0 standard drinks of alcohol    Types: 7 Cans of beer per week   Drug use: No   Sexual activity: Not on file  Other Topics Concern   Not on file  Social History Narrative   Lives with his wife. He enjoys working.   Social Determinants of Health   Financial Resource Strain: Low Risk  (05/19/2023)   Overall Financial Resource Strain (CARDIA)    Difficulty of Paying Living Expenses: Not hard at all  Food Insecurity: No Food Insecurity (05/19/2023)   Hunger Vital Sign    Worried About Running Out of Food in the Last Year: Never true    Ran Out of Food in the Last Year: Never true  Transportation Needs: No Transportation Needs (05/19/2023)   PRAPARE - Administrator, Civil Service (Medical): No    Lack of Transportation (Non-Medical): No  Physical Activity: Insufficiently Active (05/19/2023)   Exercise Vital Sign    Days of Exercise per Week: 6 days    Minutes of Exercise per Session: 20 min  Stress: No Stress Concern Present (05/19/2023)   [employer] of Occupational Health - Occupational Stress Questionnaire    Feeling of Stress : Not at all  Social Connections: Moderately Isolated (05/19/2023)   Social Connection and Isolation Panel [NHANES]    Frequency of Communication with Friends and Family: More than three times a week    Frequency of Social Gatherings with Friends and Family: More than three times a week    Attends Religious Services: Never    Database administrator or Organizations: No    Attends Engineer, structural: Never    Marital Status: Married    Tobacco Counseling Counseling given: Not  Answered   Clinical Intake:  Pre-visit preparation completed: No  Pain : No/denies pain     BMI - recorded: 30.4 Nutritional Status: BMI > 30  Obese Nutritional Risks: None Diabetes: No  How often do you need to have someone help you when you read instructions, pamphlets, or other written materials from your doctor or pharmacy?: 1 - Never What is the last grade level you completed in school?: 14  Interpreter Needed?: No      Activities of Daily Living    05/19/2023    9:51 AM  In your present state of health, do you have any difficulty  performing the following activities:  Hearing? 0  Vision? 0  Difficulty concentrating or making decisions? 0  Walking or climbing stairs? 0  Dressing or bathing? 0  Doing errands, shopping? 0  Preparing Food and eating ? N  Using the Toilet? N  In the past six months, have you accidently leaked urine? N  Do you have problems with loss of bowel control? N  Managing your Medications? N  Managing your Finances? N  Housekeeping or managing your Housekeeping? N    Patient Care Team: Agapito Games, MD as PCP - General Jens Som Madolyn Frieze, MD as PCP - Cardiology (Cardiology)   Indicate any recent Medical Services you may have received from other than [facility] providers in the past year (date may be approximate).     Assessment:   This is a routine wellness examination for [facility].  Hearing/Vision screen Hearing Screening - Comments:: Unable to test, grossly intact.  Vision Screening - Comments:: Unable to test, grossly intact, due to exm in February 2025   Goals Addressed             This Visit's Progress    Exercise 3x per week (30 min per time)       Treadmill 5 days per week 20 minutes or one mile       Depression Screen    05/19/2023   10:01 AM 05/19/2023    9:58 AM 04/29/2023    8:16 AM 09/30/2022    7:54 AM 05/13/2022    9:37 AM 05/07/2022    8:13 AM 06/27/2021    8:18 AM  PHQ 2/9 Scores  PHQ - 2 Score 0 0 0  0 0 0 0    Fall Risk    05/19/2023   10:00 AM 04/29/2023    8:16 AM 09/30/2022    7:54 AM 07/01/2022    8:13 AM 05/13/2022    9:37 AM  Fall Risk   Falls in the past year? 0 0 0 0 0  Number falls in past yr: 0 0 0 0 0  Injury with Fall? 1 0 0 0 0  Risk for fall due to : No Fall Risks No Fall Risks No Fall Risks No Fall Risks No Fall Risks  Follow up  Falls evaluation completed Falls evaluation completed Falls evaluation completed Falls evaluation completed    MEDICARE RISK AT HOME: Medicare Risk at Home Any stairs in or around the home?: Yes If so, are there any without handrails?: Yes Home free of loose throw rugs in walkways, pet beds, electrical cords, etc?: Yes Adequate lighting in your home to reduce risk of falls?: Yes Life alert?: No Use of a cane, walker or w/c?: No Grab bars in the bathroom?: Yes Shower chair or bench in shower?: Yes Elevated toilet seat or a handicapped toilet?: Yes  TIMED UP AND GO:  Was the test performed?  No    Cognitive Function:        05/19/2023   10:01 AM 05/13/2022    9:41 AM 07/11/2016    9:31 AM  6CIT Screen  What Year? 0 points 0 points 0 points  What month? 0 points 0 points 0 points  What time? 0 points 0 points 0 points  Count back from 20 0 points 0 points 0 points  Months in reverse 0 points 0 points 0 points  Repeat phrase 0 points 0 points 0 points  Total Score 0 points 0 points 0 points    Immunizations Immunization History  Administered Date(s) Administered   Fluad Quad(high Dose 65+) 03/01/2020   Fluad Trivalent(High Dose 65+) 03/29/2023   Influenza Split 03/23/2012   Influenza Whole 04/12/2009, 02/25/2011   Influenza, High Dose Seasonal PF 05/18/2015, 03/09/2017, 03/01/2018, 03/07/2019, 04/16/2022   Influenza-Unspecified 03/21/2013, 04/17/2021   Moderna Covid-19 Vaccine Bivalent Booster 46yrs & up 03/17/2022   Moderna Sars-Covid-2 Vaccination 07/31/2019, 08/28/2019, 03/29/2020   Pfizer(Comirnaty)Fall Seasonal  Vaccine 12 years and older 03/29/2023   Pneumococcal Conjugate-13 12/23/2014   Pneumococcal Polysaccharide-23 01/10/2016   Td 10/08/2005   Tdap 06/18/2015   Zoster, Live 02/21/2011    TDAP status: Up to date  Flu Vaccine status: Up to date  Pneumococcal vaccine status: Up to date  Covid-19 vaccine status: Completed vaccines  Qualifies for Shingles Vaccine? Yes   Zostavax completed Yes   Shingrix Completed?: No.    Education has been provided regarding the importance of this vaccine. Patient has been advised to call insurance company to determine out of pocket expense if they have not yet received this vaccine. Advised may also receive vaccine at local pharmacy or Health Dept. Verbalized acceptance and understanding.  Screening Tests Health Maintenance  Topic Date Due   Zoster Vaccines- Shingrix (1 of 2) 11/05/1999   COVID-19 Vaccine (6 - 2023-24 season) 07/30/2023   Medicare Annual Wellness (AWV)  05/18/2024   DTaP/Tdap/Td (3 - Td or Tdap) 06/17/2025   Colonoscopy  12/07/2026   Pneumonia Vaccine 3+ Years old  Completed   INFLUENZA VACCINE  Completed   Hepatitis C Screening  Completed   HPV VACCINES  Aged Out    Health Maintenance  Health Maintenance Due  Topic Date Due   Zoster Vaccines- Shingrix (1 of 2) 11/05/1999    Colorectal cancer screening: Type of screening: Colonoscopy. Completed 06/28/2016. Repeat every 10 years  Lung Cancer Screening: (Low Dose CT Chest recommended if Age 62-80 years, 20 pack-year currently smoking OR have quit w/in 15years.) does not qualify.   Lung Cancer Screening Referral: n/a  Additional Screening:  Hepatitis C Screening: does qualify; Completed 01/02/2015  Vision Screening: Recommended annual ophthalmology exams for early detection of glaucoma and other disorders of the eye. Is the patient up to date with their annual eye exam?  Yes  Who is the provider or what is the name of the office in which the patient attends annual eye  exams? [facility] in [place]  If pt is not established with a provider, would they like to be referred to a provider to establish care? No .   Dental Screening: Recommended annual dental exams for proper oral hygiene  Diabetic Foot Exam: n/a   Community Resource Referral / Chronic Care Management: CRR required this visit?  No   CCM required this visit?  No     Plan:     I have personally reviewed and noted the following in the patient's chart:   Medical and social history Use of alcohol, tobacco or illicit drugs  Current medications and supplements including opioid prescriptions. Patient is not currently taking opioid prescriptions. Functional ability and status Nutritional status Physical activity Advanced directives List of other physicians Hospitalizations, surgeries, and ER visits in previous 12 months: no  Vitals Screenings to include cognitive, depression, and falls Referrals and appointments  In addition, I have reviewed and discussed with patient certain preventive protocols, quality metrics, and best practice recommendations. A written personalized care plan for preventive services as well as general preventive health recommendations were provided to patient.     Servando Snare  Clementeen Hoof, FNP   05/19/2023   After Visit Summary: (MyChart) Due to this being a telephonic visit, the after visit summary with patients personalized plan was offered to patient via MyChart   Follow-up with PCP as scheduled. Shingrix vaccine at local pharmacy.  Yearly eye exam scheduled February 2025.

## 2023-07-15 DIAGNOSIS — R059 Cough, unspecified: Secondary | ICD-10-CM | POA: Diagnosis not present

## 2023-07-15 DIAGNOSIS — R509 Fever, unspecified: Secondary | ICD-10-CM | POA: Diagnosis not present

## 2023-07-23 ENCOUNTER — Other Ambulatory Visit: Payer: Self-pay | Admitting: Family Medicine

## 2023-07-23 DIAGNOSIS — E785 Hyperlipidemia, unspecified: Secondary | ICD-10-CM

## 2023-10-17 ENCOUNTER — Other Ambulatory Visit: Payer: Self-pay | Admitting: Family Medicine

## 2023-10-17 DIAGNOSIS — E7800 Pure hypercholesterolemia, unspecified: Secondary | ICD-10-CM

## 2023-10-27 ENCOUNTER — Ambulatory Visit (INDEPENDENT_AMBULATORY_CARE_PROVIDER_SITE_OTHER): Payer: Medicare Other | Admitting: Family Medicine

## 2023-10-27 ENCOUNTER — Encounter: Payer: Self-pay | Admitting: Family Medicine

## 2023-10-27 VITALS — BP 134/84 | HR 88 | Ht 70.0 in | Wt 222.0 lb

## 2023-10-27 DIAGNOSIS — Z6831 Body mass index (BMI) 31.0-31.9, adult: Secondary | ICD-10-CM | POA: Diagnosis not present

## 2023-10-27 DIAGNOSIS — I10 Essential (primary) hypertension: Secondary | ICD-10-CM | POA: Diagnosis not present

## 2023-10-27 DIAGNOSIS — R7301 Impaired fasting glucose: Secondary | ICD-10-CM | POA: Diagnosis not present

## 2023-10-27 LAB — POCT GLYCOSYLATED HEMOGLOBIN (HGB A1C): Hemoglobin A1C: 5.8 % — AB (ref 4.0–5.6)

## 2023-10-27 NOTE — Progress Notes (Addendum)
   Established Patient Office Visit  Subjective  Patient ID: Zachary Miranda, male    DOB: [birth year]  Age: 73 y.o. MRN: [id number]  Chief Complaint  Patient presents with  . Medical Management of Chronic Issues    Essential hypertension, benign,IFG (impaired fasting glucose)        HPI  Hypertension- Pt denies chest pain, SOB, dizziness, or heart palpitations.  Taking meds as directed w/o problems.  Denies medication side effects.    Impaired fasting glucose-no increased thirst or urination. No symptoms consistent with hypoglycemia.  He had some questions about weight loss medication. He is interested in GLP1 medications.  But wanted to know more about it.  Has been on Go=LO for the last 2 years and feels he has platequed with that.     ROS    Objective:     BP 134/84   Pulse 88   Ht 5\' 10"  (1.778 m)   Wt 222 lb (100.7 kg)   SpO2 99%   BMI 31.85 kg/m    Physical Exam Vitals and nursing note reviewed.  Constitutional:      Appearance: Normal appearance.  HENT:     Head: Normocephalic and atraumatic.  Eyes:     Conjunctiva/sclera: Conjunctivae normal.  Cardiovascular:     Rate and Rhythm: Normal rate and regular rhythm.  Pulmonary:     Effort: Pulmonary effort is normal.     Breath sounds: Normal breath sounds.  Skin:    General: Skin is warm and dry.  Neurological:     Mental Status: He is alert.  Psychiatric:        Mood and Affect: Mood normal.    Results for orders placed or performed in visit on 10/27/23  POCT HgB A1C  Result Value Ref Range   Hemoglobin A1C 5.8 (A) 4.0 - 5.6 %   HbA1c POC (<> result, manual entry)     HbA1c, POC (prediabetic range)     HbA1c, POC (controlled diabetic range)        The ASCVD Risk score (Arnett DK, et al., 2019) failed to calculate for the following reasons:   The valid total cholesterol range is 130 to 320 mg/dL    Assessment & Plan:   Problem List Items Addressed This Visit        Cardiovascular and Mediastinum   HYPERTENSION, BENIGN SYSTEMIC - Primary   BP at goal.  F/U in 6 months.       Relevant Orders   CMP14+EGFR   CBC     Endocrine   IFG (impaired fasting glucose)   A!C looks great at 5.8 today.  Doing really well. Would like to lose more weight.       Relevant Orders   POCT HgB A1C (Completed)   CMP14+EGFR   CBC     Other   BMI 31.0-31.9,adult   He is up about 10 lbs since last here. Feel off on his exercise recently.   Just encouraged him to get back on track but he is otherwise doing well.  We discussed GLP-1's and how they work and potential side effects of the medication.  We also discussed that Medicare does not currently cover these for weight loss only if he has diabetes and he has prediabetes.      Encouraged to schedule new shingles vaccines at the pharmacy.   Return in about 6 months (around 04/28/2024) for Wellness Exam.    Duaine German, MD

## 2023-10-27 NOTE — Assessment & Plan Note (Signed)
 A!C looks great at 5.8 today.  Doing really well. Would like to lose more weight.

## 2023-10-27 NOTE — Assessment & Plan Note (Signed)
 He is up about 10 lbs since last here. Feel off on his exercise recently.   Just encouraged him to get back on track but he is otherwise doing well.  We discussed GLP-1's and how they work and potential side effects of the medication.  We also discussed that Medicare does not currently cover these for weight loss only if he has diabetes and he has prediabetes.

## 2023-10-27 NOTE — Assessment & Plan Note (Signed)
BP at goal. F/U in 6 months.

## 2023-10-28 ENCOUNTER — Ambulatory Visit: Payer: Self-pay | Admitting: Cardiology

## 2023-10-28 LAB — CBC
Hematocrit: 43.3 % (ref 37.5–51.0)
Hemoglobin: 13.8 g/dL (ref 13.0–17.7)
MCH: 30.7 pg (ref 26.6–33.0)
MCHC: 31.9 g/dL (ref 31.5–35.7)
MCV: 96 fL (ref 79–97)
Platelets: 191 10*3/uL (ref 150–450)
RBC: 4.49 x10E6/uL (ref 4.14–5.80)
RDW: 12.8 % (ref 11.6–15.4)
WBC: 5.8 10*3/uL (ref 3.4–10.8)

## 2023-10-28 LAB — CMP14+EGFR
ALT: 19 IU/L (ref 0–44)
AST: 20 IU/L (ref 0–40)
Albumin: 4.2 g/dL (ref 3.8–4.8)
Alkaline Phosphatase: 81 IU/L (ref 44–121)
BUN/Creatinine Ratio: 18 (ref 10–24)
BUN: 17 mg/dL (ref 8–27)
Bilirubin Total: 0.8 mg/dL (ref 0.0–1.2)
CO2: 21 mmol/L (ref 20–29)
Calcium: 9.3 mg/dL (ref 8.6–10.2)
Chloride: 106 mmol/L (ref 96–106)
Creatinine, Ser: 0.95 mg/dL (ref 0.76–1.27)
Globulin, Total: 1.9 g/dL (ref 1.5–4.5)
Glucose: 112 mg/dL — ABNORMAL HIGH (ref 70–99)
Potassium: 4.4 mmol/L (ref 3.5–5.2)
Sodium: 141 mmol/L (ref 134–144)
Total Protein: 6.1 g/dL (ref 6.0–8.5)
eGFR: 85 mL/min/{1.73_m2} (ref >59)

## 2023-12-17 ENCOUNTER — Other Ambulatory Visit: Payer: Self-pay | Admitting: *Deleted

## 2023-12-17 DIAGNOSIS — I77810 Thoracic aortic ectasia: Secondary | ICD-10-CM

## 2023-12-26 ENCOUNTER — Other Ambulatory Visit: Payer: Self-pay | Admitting: Family Medicine

## 2023-12-26 DIAGNOSIS — E7800 Pure hypercholesterolemia, unspecified: Secondary | ICD-10-CM

## 2023-12-29 ENCOUNTER — Ambulatory Visit

## 2023-12-29 DIAGNOSIS — I77810 Thoracic aortic ectasia: Secondary | ICD-10-CM

## 2023-12-29 DIAGNOSIS — K760 Fatty (change of) liver, not elsewhere classified: Secondary | ICD-10-CM | POA: Diagnosis not present

## 2023-12-29 DIAGNOSIS — I700 Atherosclerosis of aorta: Secondary | ICD-10-CM | POA: Diagnosis not present

## 2023-12-29 DIAGNOSIS — K8020 Calculus of gallbladder without cholecystitis without obstruction: Secondary | ICD-10-CM | POA: Diagnosis not present

## 2023-12-29 MED ORDER — IOHEXOL 350 MG/ML SOLN
100.0000 mL | Freq: Once | INTRAVENOUS | Status: AC | PRN
  Administered 2023-12-29: 100 mL via INTRAVENOUS

## 2023-12-31 ENCOUNTER — Ambulatory Visit: Payer: Self-pay | Admitting: Cardiology

## 2024-02-17 ENCOUNTER — Encounter: Payer: Self-pay | Admitting: Sports Medicine

## 2024-04-21 ENCOUNTER — Other Ambulatory Visit: Payer: Self-pay | Admitting: Family Medicine

## 2024-04-21 DIAGNOSIS — I10 Essential (primary) hypertension: Secondary | ICD-10-CM

## 2024-04-21 DIAGNOSIS — E785 Hyperlipidemia, unspecified: Secondary | ICD-10-CM

## 2024-04-26 ENCOUNTER — Encounter: Payer: Self-pay | Admitting: Family Medicine

## 2024-04-26 ENCOUNTER — Ambulatory Visit (INDEPENDENT_AMBULATORY_CARE_PROVIDER_SITE_OTHER): Admitting: Family Medicine

## 2024-04-26 VITALS — BP 133/83 | HR 75 | Ht 70.0 in | Wt 226.1 lb

## 2024-04-26 DIAGNOSIS — E7800 Pure hypercholesterolemia, unspecified: Secondary | ICD-10-CM

## 2024-04-26 DIAGNOSIS — I10 Essential (primary) hypertension: Secondary | ICD-10-CM

## 2024-04-26 DIAGNOSIS — R7301 Impaired fasting glucose: Secondary | ICD-10-CM | POA: Diagnosis not present

## 2024-04-26 DIAGNOSIS — Z0000 Encounter for general adult medical examination without abnormal findings: Secondary | ICD-10-CM

## 2024-04-26 DIAGNOSIS — L989 Disorder of the skin and subcutaneous tissue, unspecified: Secondary | ICD-10-CM

## 2024-04-26 DIAGNOSIS — E785 Hyperlipidemia, unspecified: Secondary | ICD-10-CM

## 2024-04-26 NOTE — Progress Notes (Unsigned)
 Complete physical exam  Patient: Zachary Miranda    DOB: August 06, 1950 73 y.o.   MRN: [id number]  Chief Complaint  Patient presents with  . Annual Exam    Subjective:    Zachary Miranda is a 73 y.o. male who presents today for a complete physical exam. He reports consuming a {diet types:17450} diet. {types:19826} He generally feels well. He reports sleeping {DESC; WELL/FAIRLY WELL/POORLY:18703}. He {does/does not:200015} have additional problems to discuss today.   Discussed the use of AI scribe software for clinical note transcription with the patient, who gave verbal consent to proceed.  History of Present Illness    Most recent fall risk assessment:    04/26/2024    7:36 AM  Fall Risk   Falls in the past year? 0  Number falls in past yr: 0  Injury with Fall? 0  Risk for fall due to : No Fall Risks  Follow up Falls evaluation completed     Most recent depression screenings:    04/26/2024    7:36 AM 05/19/2023   10:01 AM  PHQ 2/9 Scores  PHQ - 2 Score 0 0    {VISON DENTAL STD PSA (Optional):27386}    Patient Care Team: Alvan Dorothyann BIRCH, MD as PCP - General Pietro Redell RAMAN, MD as PCP - Cardiology (Cardiology)   ROS    Objective:    BP 133/83   Pulse 75   Ht 5' 10 (1.778 m)   Wt 226 lb 1.9 oz (102.6 kg)   SpO2 96%   BMI 32.44 kg/m     Physical Exam Vitals and nursing note reviewed.  Constitutional:      Appearance: Normal appearance.  HENT:     Head: Normocephalic and atraumatic.     Right Ear: Tympanic membrane, ear canal and external ear normal.     Left Ear: Tympanic membrane, ear canal and external ear normal.     Nose: Nose normal.     Mouth/Throat:     Pharynx: Oropharynx is clear.  Eyes:     Extraocular Movements: Extraocular movements intact.     Conjunctiva/sclera: Conjunctivae normal.     Pupils: Pupils are equal, round, and reactive to light.  Neck:     Thyroid: No thyromegaly.  Cardiovascular:     Rate and  Rhythm: Normal rate and regular rhythm.  Pulmonary:     Effort: Pulmonary effort is normal.     Breath sounds: Normal breath sounds.  Abdominal:     General: Bowel sounds are normal.     Palpations: Abdomen is soft.     Tenderness: There is no abdominal tenderness.  Musculoskeletal:        General: No swelling.     Cervical back: Neck supple.  Skin:    General: Skin is warm and dry.  Neurological:     Mental Status: He is alert and oriented to person, place, and time.  Psychiatric:        Mood and Affect: Mood normal.        Behavior: Behavior normal.     {PhysExam Abridge (Optional):210964309}  No results found for any visits on 04/26/24. {Show previous labs (optional):23779}     Assessment & Plan:    Routine Health Maintenance and Physical Exam Immunization History  Administered Date(s) Administered  . Fluad Quad(high Dose 65+) 03/01/2020  . Fluad Trivalent(High Dose 65+) 03/29/2023  . INFLUENZA, HIGH DOSE SEASONAL PF 05/18/2015, 03/09/2017, 03/01/2018, 03/07/2019, 04/16/2022  . Influenza Split 03/23/2012  .  Influenza Whole 04/12/2009, 02/25/2011  . Influenza-Unspecified 03/21/2013, 04/17/2021  . Moderna Covid-19 Vaccine Bivalent Booster 33yrs & up 03/17/2022  . Moderna Sars-Covid-2 Vaccination 07/31/2019, 08/28/2019, 03/29/2020  . Pfizer(Comirnaty)Fall Seasonal Vaccine 12 years and older 03/29/2023  . Pneumococcal Conjugate-13 12/23/2014  . Pneumococcal Polysaccharide-23 01/10/2016  . Td 10/08/2005  . Tdap 06/18/2015  . Zoster, Live 02/21/2011    Health Maintenance  Topic Date Due  . Zoster Vaccines- Shingrix (1 of 2) 11/05/1999  . Influenza Vaccine  01/16/2024  . COVID-19 Vaccine (6 - 2025-26 season) 02/16/2024  . Medicare Annual Wellness (AWV)  05/18/2024  . DTaP/Tdap/Td (3 - Td or Tdap) 06/17/2025  . Colonoscopy  12/07/2026  . Pneumococcal Vaccine: 50+ Years  Completed  . Hepatitis C Screening  Completed  . Meningococcal B Vaccine  Aged Out     Discussed health benefits of physical activity, and encouraged him to engage in regular exercise appropriate for his age and condition.  Problem List Items Addressed This Visit       Cardiovascular and Mediastinum   HYPERTENSION, BENIGN SYSTEMIC - Primary     Endocrine   IFG (impaired fasting glucose)     Other   Hyperlipidemia   Other Visit Diagnoses       Hyperlipidemia LDL goal <100         Wellness examination           Assessment and Plan Assessment & Plan     No follow-ups on file.    Dorothyann Byars, MD [facility] at [facility]

## 2024-04-27 ENCOUNTER — Ambulatory Visit: Payer: Self-pay | Admitting: Cardiology

## 2024-04-27 LAB — LIPID PANEL WITH LDL/HDL RATIO
Cholesterol, Total: 141 mg/dL (ref 100–199)
HDL: 60 mg/dL (ref >39)
LDL Chol Calc (NIH): 64 mg/dL (ref 0–99)
LDL/HDL Ratio: 1.1 ratio (ref 0.0–3.6)
Triglycerides: 94 mg/dL (ref 0–149)
VLDL Cholesterol Cal: 17 mg/dL (ref 5–40)

## 2024-04-27 LAB — CBC
Hematocrit: 44.2 % (ref 37.5–51.0)
Hemoglobin: 14.5 g/dL (ref 13.0–17.7)
MCH: 31.5 pg (ref 26.6–33.0)
MCHC: 32.8 g/dL (ref 31.5–35.7)
MCV: 96 fL (ref 79–97)
Platelets: 214 x10E3/uL (ref 150–450)
RBC: 4.61 x10E6/uL (ref 4.14–5.80)
RDW: 12.3 % (ref 11.6–15.4)
WBC: 6.1 x10E3/uL (ref 3.4–10.8)

## 2024-04-27 LAB — CMP14+EGFR
ALT: 18 IU/L (ref 0–44)
AST: 23 IU/L (ref 0–40)
Albumin: 4.4 g/dL (ref 3.8–4.8)
Alkaline Phosphatase: 81 IU/L (ref 47–123)
BUN/Creatinine Ratio: 17 (ref 10–24)
BUN: 18 mg/dL (ref 8–27)
Bilirubin Total: 0.8 mg/dL (ref 0.0–1.2)
CO2: 23 mmol/L (ref 20–29)
Calcium: 9.4 mg/dL (ref 8.6–10.2)
Chloride: 103 mmol/L (ref 96–106)
Creatinine, Ser: 1.03 mg/dL (ref 0.76–1.27)
Globulin, Total: 1.8 g/dL (ref 1.5–4.5)
Glucose: 110 mg/dL — ABNORMAL HIGH (ref 70–99)
Potassium: 4.3 mmol/L (ref 3.5–5.2)
Sodium: 141 mmol/L (ref 134–144)
Total Protein: 6.2 g/dL (ref 6.0–8.5)
eGFR: 76 mL/min/1.73 (ref >59)

## 2024-04-27 LAB — HEMOGLOBIN A1C
Est. average glucose Bld gHb Est-mCnc: 131 mg/dL
Hgb A1c MFr Bld: 6.2 % — ABNORMAL HIGH (ref 4.8–5.6)

## 2024-04-27 MED ORDER — FLUOROURACIL 5 % EX CREA: TOPICAL_CREAM | Freq: Every day | CUTANEOUS | 0 refills | Status: AC

## 2024-04-27 NOTE — Progress Notes (Signed)
 Sorry for the delay. Sent in today

## 2024-04-27 NOTE — Progress Notes (Signed)
 HI [name], A1c up a little bit compared to 6 months ago continue to work on diet and regular exercise.  All other labs look good.

## 2024-10-25 ENCOUNTER — Ambulatory Visit: Admitting: Family Medicine
